# Patient Record
Sex: Female | Born: 1955 | Race: White | Hispanic: No | Smoking: Current every day smoker
Health system: Southern US, Community
[De-identification: ages and names within clinical notes are randomized; demographics above are authoritative.]

## PROBLEM LIST (undated history)

## (undated) DIAGNOSIS — M549 Dorsalgia, unspecified: Secondary | ICD-10-CM

## (undated) DIAGNOSIS — E041 Nontoxic single thyroid nodule: Secondary | ICD-10-CM

## (undated) DIAGNOSIS — Z972 Presence of dental prosthetic device (complete) (partial): Secondary | ICD-10-CM

## (undated) DIAGNOSIS — G8929 Other chronic pain: Secondary | ICD-10-CM

## (undated) DIAGNOSIS — I10 Essential (primary) hypertension: Secondary | ICD-10-CM

## (undated) DIAGNOSIS — T8859XA Other complications of anesthesia, initial encounter: Secondary | ICD-10-CM

## (undated) DIAGNOSIS — K635 Polyp of colon: Secondary | ICD-10-CM

## (undated) DIAGNOSIS — T4145XA Adverse effect of unspecified anesthetic, initial encounter: Secondary | ICD-10-CM

## (undated) DIAGNOSIS — K219 Gastro-esophageal reflux disease without esophagitis: Secondary | ICD-10-CM

## (undated) DIAGNOSIS — M5030 Other cervical disc degeneration, unspecified cervical region: Secondary | ICD-10-CM

## (undated) HISTORY — PX: ROTATOR CUFF REPAIR: SHX139

## (undated) HISTORY — PX: TONSILLECTOMY: SUR1361

## (undated) HISTORY — PX: PARTIAL HYSTERECTOMY: SHX80

---

## 2014-08-06 ENCOUNTER — Emergency Department: Payer: Medicare HMO

## 2014-08-06 ENCOUNTER — Encounter: Payer: Self-pay | Admitting: Emergency Medicine

## 2014-08-06 ENCOUNTER — Emergency Department
Admission: EM | Admit: 2014-08-06 | Discharge: 2014-08-06 | Disposition: A | Discharge status: Home / Self Care | Payer: Medicare HMO | Attending: Emergency Medicine | Admitting: Emergency Medicine

## 2014-08-06 DIAGNOSIS — Z79899 Other long term (current) drug therapy: Secondary | ICD-10-CM | POA: Insufficient documentation

## 2014-08-06 DIAGNOSIS — Y9389 Activity, other specified: Secondary | ICD-10-CM | POA: Insufficient documentation

## 2014-08-06 DIAGNOSIS — Z720 Tobacco use: Secondary | ICD-10-CM | POA: Insufficient documentation

## 2014-08-06 DIAGNOSIS — Y92009 Unspecified place in unspecified non-institutional (private) residence as the place of occurrence of the external cause: Secondary | ICD-10-CM | POA: Diagnosis not present

## 2014-08-06 DIAGNOSIS — S3992XA Unspecified injury of lower back, initial encounter: Secondary | ICD-10-CM | POA: Diagnosis not present

## 2014-08-06 DIAGNOSIS — S299XXA Unspecified injury of thorax, initial encounter: Secondary | ICD-10-CM | POA: Insufficient documentation

## 2014-08-06 DIAGNOSIS — M545 Low back pain, unspecified: Secondary | ICD-10-CM

## 2014-08-06 DIAGNOSIS — I10 Essential (primary) hypertension: Secondary | ICD-10-CM | POA: Diagnosis not present

## 2014-08-06 DIAGNOSIS — G8929 Other chronic pain: Secondary | ICD-10-CM | POA: Diagnosis not present

## 2014-08-06 DIAGNOSIS — Y998 Other external cause status: Secondary | ICD-10-CM | POA: Insufficient documentation

## 2014-08-06 DIAGNOSIS — R0781 Pleurodynia: Secondary | ICD-10-CM

## 2014-08-06 DIAGNOSIS — M546 Pain in thoracic spine: Secondary | ICD-10-CM

## 2014-08-06 DIAGNOSIS — W06XXXA Fall from bed, initial encounter: Secondary | ICD-10-CM | POA: Diagnosis not present

## 2014-08-06 HISTORY — DX: Essential (primary) hypertension: I10

## 2014-08-06 HISTORY — DX: Other cervical disc degeneration, unspecified cervical region: M50.30

## 2014-08-06 HISTORY — DX: Dorsalgia, unspecified: M54.9

## 2014-08-06 HISTORY — DX: Other chronic pain: G89.29

## 2014-08-06 LAB — URINALYSIS COMPLETE WITH MICROSCOPIC (ARMC ONLY)
Bacteria, UA: NONE SEEN
Bilirubin Urine: NEGATIVE
Glucose, UA: NEGATIVE mg/dL
Hgb urine dipstick: NEGATIVE
Ketones, ur: NEGATIVE mg/dL
NITRITE: NEGATIVE
PH: 5 (ref 5.0–8.0)
PROTEIN: NEGATIVE mg/dL
SPECIFIC GRAVITY, URINE: 1.023 (ref 1.005–1.030)

## 2014-08-06 MED ORDER — PIPERACILLIN-TAZOBACTAM 3.375 G IVPB
3.3750 g | Freq: Once | INTRAVENOUS | Status: DC
  Filled 2014-08-06: qty 50

## 2014-08-06 MED ORDER — OXYCODONE-ACETAMINOPHEN 5-325 MG PO TABS
2.0000 | ORAL_TABLET | Freq: Once | ORAL | Status: AC
  Administered 2014-08-06: 2 via ORAL
  Filled 2014-08-06: qty 2

## 2014-08-06 NOTE — ED Notes (Signed)
Patient transported to X-ray 

## 2014-08-06 NOTE — ED Provider Notes (Signed)
[facility] Emergency Department Provider Note  ____________________________________________  Time seen: Approximately 8:03 AM  I have reviewed the triage vital signs and the nursing notes.   HISTORY  Chief Complaint Back Pain and Fall    HPI Monica Mcclure is a 59 y.o. female who rolled out of bed this morning. The patient fell directly on her back and reports that she hit her head. The patient is having pain in her entire back worse in the lower back and the mid back. She denies LOC with the injury. The patient is also reporting some right sided rib pain as well. The patient reports that it felt as though the wind was knocked out of her. She is rating her pain a 10/10. She did not take anything for pain. The patient has a history of chronic back pain and has an appointment with orthopedics today. She also has been seen at a chronic pain physician.   Past Medical History  Diagnosis Date  . Hypertension   . Degenerative cervical disc   . Chronic back pain     There are no active problems to display for this patient.   Past Surgical History  Procedure Laterality Date  . Partial hysterectomy      Current Outpatient Rx  Name  Route  Sig  Dispense  Refill  . clonazePAM (KLONOPIN) 0.5 MG tablet   Oral   Take 0.5 mg by mouth 2 (two) times daily.         Marland Kitchen gabapentin (NEURONTIN) 600 MG tablet   Oral   Take 600 mg by mouth 4 (four) times daily.         Marland Kitchen HYDROmorphone (DILAUDID) 4 MG tablet   Oral   Take by mouth 4 (four) times daily.         Marland Kitchen lisinopril-hydrochlorothiazide (PRINZIDE,ZESTORETIC) 10-12.5 MG per tablet   Oral   Take 1 tablet by mouth 2 (two) times daily.         Marland Kitchen oxymorphone (OPANA ER) 40 MG 12 hr tablet   Oral   Take 40 mg by mouth every 12 (twelve) hours.           Allergies Tramadol  No family history on file.  Social History History  Substance Use Topics  . Smoking status: Current Every Day Smoker --  1.00 packs/day    Types: Cigarettes  . Smokeless tobacco: Not on file  . Alcohol Use: No    Review of Systems Constitutional: No fever/chills Eyes: No visual changes. ENT: No sore throat. Cardiovascular: Denies chest pain. Respiratory: Denies shortness of breath. Gastrointestinal: No abdominal pain.  No nausea, no vomiting.  No diarrhea.  No constipation. Genitourinary: Negative for dysuria. Musculoskeletal: Negative for back pain. Skin: Negative for rash. Neurological: Negative for headaches, focal weakness or numbness.  10-point ROS otherwise negative.  ____________________________________________   PHYSICAL EXAM:  VITAL SIGNS: ED Triage Vitals  Enc Vitals Group     BP 08/06/14 0558 156/90 mmHg     Pulse Rate 08/06/14 0558 86     Resp 08/06/14 0558 18     Temp 08/06/14 0558 98.6 F (37 C)     Temp Source 08/06/14 0558 Oral     SpO2 08/06/14 0556 98 %     Weight 08/06/14 0558 200 lb (90.719 kg)     Height 08/06/14 0558 5\' 6"  (1.676 m)     Head Cir --      Peak Flow --  Pain Score 08/06/14 0601 10     Pain Loc --      Pain Edu? --      Excl. in [place]? --     Constitutional: Alert and oriented. Well appearing and in moderate distress. Eyes: Conjunctivae are normal. PERRL. EOMI. Head: Atraumatic. Nose: No congestion/rhinnorhea. Mouth/Throat: Mucous membranes are moist.  Oropharynx non-erythematous. Neck: Mild cervical spine tenderness to palpation. Cardiovascular: Normal rate, regular rhythm. Grossly normal heart sounds.  Good peripheral circulation. Respiratory: Normal respiratory effort.  No retractions. Lungs CTAB. Gastrointestinal: Soft and nontender. No distention. Positive bowel sounds Genitourinary: Deferred Musculoskeletal: Mid back tenderness to palpation right sided tenderness to palpation upper lumbar spine tenderness to palpation lower back pain to palpation. Neurologic:  Normal speech and language.  Skin:  Skin is warm, dry and intact. No rash  noted. Psychiatric: Mood and affect are normal.  ____________________________________________   LABS (all labs ordered are listed, but only abnormal results are displayed)  Labs Reviewed - No data to display ____________________________________________  EKG  none ____________________________________________  RADIOLOGY  CT head and cervical spine: Negative CT of the head, no evidence for acute trauma, degenerative changes in the cervical spine without acute fracture or traumatic subluxation, incidental nodule in the right lower thyroid Thoracic spine x-ray: No acute fracture of the thoracic spine, question 12th rib fractures ____________________________________________   PROCEDURES  Procedure(s) performed: None  Critical Care performed: No  ____________________________________________   INITIAL IMPRESSION / ASSESSMENT AND PLAN / ED COURSE  Pertinent labs & imaging results that were available during my care of the patient were reviewed by me and considered in my medical decision making (see chart for details).  The patient received a dose of Percocet for her pain which she reports helped him very little bit. The patient may have some Tylenol for fractures but reports she has had rib fractures in the past. Given the new back pain and will do a lumbar spine x-ray as well to evaluate for acute fracture. The patient is still following with her orthopedic surgeon today.  The patient's care will be signed out to Dr Cinda Quest who will follow up the results of the xray. ____________________________________________   FINAL CLINICAL IMPRESSION(S) / ED DIAGNOSES  Final diagnoses:  Bilateral low back pain without sciatica  Rib pain  Midline thoracic back pain      Loney Hering, MD 08/06/14 [phone]

## 2014-08-06 NOTE — ED Notes (Addendum)
Patient present to ED via [facility] from home c/o of lower back pain after rolling out of bed this morning. Per EMS patient has history of chronic back pain but patient reports pain to a new spot on her back. Patient states "I rolled out of bed onto the concrete floor.Marland KitchenMarland KitchenI was having a dream and rolled out of bed; I hit my head and my back on the floor." Per EMS, fire department reports "after patient rolled out of bed, patient pulled her alarm, made her bed, and grabbed a bottle of water prior to their (fire department) arrival." Patient states she has a doctors appointment with [place] at 10 am this morning. No obvious swelling noted to the back of head. Patient denies LOC, chest pain, blurry vision , or shortness of breath. Patient alert and oriented x 4, respirations even and unlabored, call bell within reach. Dr. Dahlia Client at bedside.

## 2014-08-06 NOTE — ED Provider Notes (Signed)
T-spine x-ray thought they saw bilateral T12 rib fractures then a chest x-ray thought there was a ninth rib fracture or rib films think it may be a seventh rib fracture patient really doesn't have any particularly focal pain and none of the rib fractures are the same I will discharge her she has plenty of pain medicines at home she says that she will follow with orthopedic doctor  Nena Polio, MD 08/06/14 1205

## 2014-08-06 NOTE — ED Notes (Signed)
Lab notified to cancel CK order per Dr. Cinda Quest.

## 2014-08-15 ENCOUNTER — Other Ambulatory Visit: Payer: Self-pay | Admitting: Family Medicine

## 2014-08-15 DIAGNOSIS — Z1231 Encounter for screening mammogram for malignant neoplasm of breast: Secondary | ICD-10-CM

## 2014-08-15 DIAGNOSIS — E041 Nontoxic single thyroid nodule: Secondary | ICD-10-CM

## 2014-08-22 ENCOUNTER — Ambulatory Visit
Admission: RE | Admit: 2014-08-22 | Discharge: 2014-08-22 | Disposition: A | Discharge status: Home / Self Care | Payer: Medicare HMO | Source: Ambulatory Visit | Attending: Family Medicine | Admitting: Family Medicine

## 2014-08-22 DIAGNOSIS — E041 Nontoxic single thyroid nodule: Secondary | ICD-10-CM | POA: Diagnosis present

## 2014-08-22 DIAGNOSIS — E042 Nontoxic multinodular goiter: Secondary | ICD-10-CM | POA: Diagnosis not present

## 2014-10-01 ENCOUNTER — Other Ambulatory Visit: Payer: Self-pay | Admitting: Otolaryngology

## 2014-10-01 DIAGNOSIS — E041 Nontoxic single thyroid nodule: Secondary | ICD-10-CM

## 2014-10-03 ENCOUNTER — Other Ambulatory Visit: Payer: Self-pay | Admitting: Radiology

## 2014-10-04 ENCOUNTER — Ambulatory Visit
Admission: RE | Admit: 2014-10-04 | Discharge: 2014-10-04 | Disposition: A | Discharge status: Home / Self Care | Payer: Medicare HMO | Source: Ambulatory Visit | Attending: Otolaryngology | Admitting: Otolaryngology

## 2014-10-04 DIAGNOSIS — E041 Nontoxic single thyroid nodule: Secondary | ICD-10-CM | POA: Insufficient documentation

## 2014-10-04 HISTORY — DX: Adverse effect of unspecified anesthetic, initial encounter: T41.45XA

## 2014-10-04 HISTORY — DX: Nontoxic single thyroid nodule: E04.1

## 2014-10-04 HISTORY — DX: Other complications of anesthesia, initial encounter: T88.59XA

## 2014-10-04 NOTE — Procedures (Signed)
Procedure and risks were discussed with patient and informed consent was obtained. US-guided thyroid FNA will be performed.

## 2014-10-04 NOTE — Procedures (Signed)
Under US guidance, FNA of right thyroid nodule was performed.

## 2014-10-07 LAB — CYTOLOGY - NON PAP

## 2014-10-15 ENCOUNTER — Other Ambulatory Visit: Payer: Self-pay | Admitting: Otolaryngology

## 2014-10-15 DIAGNOSIS — E041 Nontoxic single thyroid nodule: Secondary | ICD-10-CM

## 2014-10-16 ENCOUNTER — Encounter: Payer: Self-pay | Admitting: *Deleted

## 2014-10-17 ENCOUNTER — Encounter
Admission: RE | Disposition: A | Discharge status: Home / Self Care | Payer: Self-pay | Source: Ambulatory Visit | Attending: Gastroenterology

## 2014-10-17 ENCOUNTER — Encounter: Payer: Self-pay | Admitting: Anesthesiology

## 2014-10-17 ENCOUNTER — Ambulatory Visit
Admission: RE | Admit: 2014-10-17 | Discharge: 2014-10-17 | Disposition: A | Discharge status: Home / Self Care | Payer: Medicare HMO | Source: Ambulatory Visit | Attending: Gastroenterology | Admitting: Gastroenterology

## 2014-10-17 ENCOUNTER — Ambulatory Visit: Payer: Medicare HMO | Admitting: Anesthesiology

## 2014-10-17 DIAGNOSIS — Z888 Allergy status to other drugs, medicaments and biological substances status: Secondary | ICD-10-CM | POA: Diagnosis not present

## 2014-10-17 DIAGNOSIS — D128 Benign neoplasm of rectum: Secondary | ICD-10-CM | POA: Insufficient documentation

## 2014-10-17 DIAGNOSIS — Z1211 Encounter for screening for malignant neoplasm of colon: Secondary | ICD-10-CM | POA: Diagnosis present

## 2014-10-17 DIAGNOSIS — M1990 Unspecified osteoarthritis, unspecified site: Secondary | ICD-10-CM | POA: Diagnosis not present

## 2014-10-17 DIAGNOSIS — M5030 Other cervical disc degeneration, unspecified cervical region: Secondary | ICD-10-CM | POA: Insufficient documentation

## 2014-10-17 DIAGNOSIS — Z91013 Allergy to seafood: Secondary | ICD-10-CM | POA: Insufficient documentation

## 2014-10-17 DIAGNOSIS — F17210 Nicotine dependence, cigarettes, uncomplicated: Secondary | ICD-10-CM | POA: Insufficient documentation

## 2014-10-17 DIAGNOSIS — K644 Residual hemorrhoidal skin tags: Secondary | ICD-10-CM | POA: Diagnosis not present

## 2014-10-17 DIAGNOSIS — I10 Essential (primary) hypertension: Secondary | ICD-10-CM | POA: Insufficient documentation

## 2014-10-17 DIAGNOSIS — K5730 Diverticulosis of large intestine without perforation or abscess without bleeding: Secondary | ICD-10-CM | POA: Diagnosis not present

## 2014-10-17 DIAGNOSIS — Z885 Allergy status to narcotic agent status: Secondary | ICD-10-CM | POA: Insufficient documentation

## 2014-10-17 DIAGNOSIS — G8929 Other chronic pain: Secondary | ICD-10-CM | POA: Insufficient documentation

## 2014-10-17 DIAGNOSIS — M549 Dorsalgia, unspecified: Secondary | ICD-10-CM | POA: Insufficient documentation

## 2014-10-17 DIAGNOSIS — K635 Polyp of colon: Secondary | ICD-10-CM | POA: Insufficient documentation

## 2014-10-17 HISTORY — PX: COLONOSCOPY WITH PROPOFOL: SHX5780

## 2014-10-17 SURGERY — COLONOSCOPY WITH PROPOFOL
Anesthesia: General

## 2014-10-17 MED ORDER — FENTANYL CITRATE (PF) 100 MCG/2ML IJ SOLN
INTRAMUSCULAR | Status: DC | PRN
  Administered 2014-10-17: 50 ug via INTRAVENOUS

## 2014-10-17 MED ORDER — EPHEDRINE SULFATE 50 MG/ML IJ SOLN
INTRAMUSCULAR | Status: DC | PRN
  Administered 2014-10-17: 5 mg via INTRAVENOUS

## 2014-10-17 MED ORDER — SODIUM CHLORIDE 0.9 % IV SOLN
INTRAVENOUS | Status: DC
Start: 2014-10-17 — End: 2014-10-17
  Administered 2014-10-17: 09:00:00 via INTRAVENOUS

## 2014-10-17 MED ORDER — PROPOFOL 500 MG/50ML IV EMUL
INTRAVENOUS | Status: DC | PRN
  Administered 2014-10-17: 100 ug/kg/min via INTRAVENOUS

## 2014-10-17 MED ORDER — MIDAZOLAM HCL 2 MG/2ML IJ SOLN
INTRAMUSCULAR | Status: DC | PRN
  Administered 2014-10-17: 1 mg via INTRAVENOUS

## 2014-10-17 NOTE — Anesthesia Preprocedure Evaluation (Signed)
Anesthesia Evaluation  Patient identified by MRN, date of birth, ID band Patient awake    Reviewed: Allergy & Precautions, H&P , NPO status , Patient's Chart, lab work & pertinent test results  History of Anesthesia Complications (+) PROLONGED EMERGENCE and history of anesthetic complications  Airway Mallampati: III  TM Distance: >3 FB Neck ROM: limited    Dental  (+) Poor Dentition, Missing, Upper Dentures, Lower Dentures   Pulmonary neg shortness of breath, Current Smoker,    Pulmonary exam normal breath sounds clear to auscultation       Cardiovascular Exercise Tolerance: Good hypertension, (-) Past MI Normal cardiovascular exam Rhythm:regular Rate:Normal     Neuro/Psych negative neurological ROS  negative psych ROS   GI/Hepatic negative GI ROS, Neg liver ROS,   Endo/Other  negative endocrine ROS  Renal/GU negative Renal ROS  negative genitourinary   Musculoskeletal  (+) Arthritis ,   Abdominal   Peds  Hematology negative hematology ROS (+)   Anesthesia Other Findings Past Medical History:   Hypertension                                                 Degenerative cervical disc                                   Chronic back pain                                            Complication of anesthesia                                     Comment:"hard time waking me up"   Thyroid nodule                                              Past Surgical History:   PARTIAL HYSTERECTOMY                                          ROTATOR CUFF REPAIR                             Right               Reproductive/Obstetrics negative OB ROS                             Anesthesia Physical Anesthesia Plan  ASA: III  Anesthesia Plan: General   Post-op Pain Management:    Induction:   Airway Management Planned:   Additional Equipment:   Intra-op Plan:   Post-operative Plan:   Informed  Consent: I have reviewed the patients History and Physical, chart, labs and discussed the procedure including the risks, benefits and alternatives for the proposed anesthesia with the patient or authorized representative who  has indicated his/her understanding and acceptance.   Dental Advisory Given  Plan Discussed with: Anesthesiologist, CRNA and Surgeon  Anesthesia Plan Comments:         Anesthesia Quick Evaluation

## 2014-10-17 NOTE — Discharge Instructions (Signed)

## 2014-10-17 NOTE — H&P (Signed)
  Primary Care Physician:  Lynnell Jude, MD  Pre-Procedure History & Physical: HPI:  Monica Mcclure is a 59 y.o. female is here for an colonoscopy.   Past Medical History  Diagnosis Date  . Hypertension   . Degenerative cervical disc   . Chronic back pain   . Complication of anesthesia     "hard time waking me up"  . Thyroid nodule     Past Surgical History  Procedure Laterality Date  . Partial hysterectomy    . Rotator cuff repair Right     Prior to Admission medications   Medication Sig Start Date End Date Taking? Authorizing Provider  clonazePAM (KLONOPIN) 0.5 MG tablet Take 0.5 mg by mouth 2 (two) times daily.   Yes Historical Provider, MD  escitalopram (LEXAPRO) 20 MG tablet Take 20 mg by mouth daily.   Yes Historical Provider, MD  gabapentin (NEURONTIN) 600 MG tablet Take 600 mg by mouth 4 (four) times daily.   Yes Historical Provider, MD  HYDROmorphone (DILAUDID) 4 MG tablet Take by mouth 4 (four) times daily.   Yes Historical Provider, MD  lisinopril-hydrochlorothiazide (PRINZIDE,ZESTORETIC) 10-12.5 MG per tablet Take 1 tablet by mouth 2 (two) times daily.   Yes Historical Provider, MD  oxymorphone (OPANA ER) 40 MG 12 hr tablet Take 40 mg by mouth every 12 (twelve) hours.   Yes Historical Provider, MD    Allergies as of 09/25/2014 - Review Complete 08/06/2014  Allergen Reaction Noted  . Tramadol  08/06/2014    History reviewed. No pertinent family history.  Social History   Social History  . Marital Status: Widowed    Spouse Name: N/A  . Number of Children: N/A  . Years of Education: N/A   Occupational History  . Not on file.   Social History Main Topics  . Smoking status: Current Every Day Smoker -- 1.00 packs/day    Types: Cigarettes  . Smokeless tobacco: Not on file  . Alcohol Use: No  . Drug Use: Not on file  . Sexual Activity: Not on file   Other Topics Concern  . Not on file   Social History Narrative     Physical Exam: BP 151/88  mmHg  Pulse 84  Temp(Src) 98.1 F (36.7 C) (Tympanic)  Resp 20  SpO2 97% General:   Alert,  pleasant and cooperative in NAD Head:  Normocephalic and atraumatic. Neck:  Supple; no masses or thyromegaly. Lungs:  Clear throughout to auscultation.    Heart:  Regular rate and rhythm. Abdomen:  Soft, nontender and nondistended. Normal bowel sounds, without guarding, and without rebound.   Neurologic:  Alert and  oriented x4;  grossly normal neurologically.  Impression/Plan: [name] Overall is here for an colonoscopy to be performed for screening  Risks, benefits, limitations, and alternatives regarding  colonoscopy have been reviewed with the patient.  Questions have been answered.  All parties agreeable.   [name], MD  10/17/2014, 9:50 AM

## 2014-10-17 NOTE — Transfer of Care (Signed)
Immediate Anesthesia Transfer of Care Note  Patient: [name] [facility]  Procedure(s) Performed: Procedure(s): COLONOSCOPY WITH PROPOFOL (N/A)  Patient Location: PACU  Anesthesia Type:General  Level of Consciousness: awake, alert , oriented and sedated  Airway & Oxygen Therapy: Patient Spontanous Breathing and Patient connected to nasal cannula oxygen  Post-op Assessment: Report given to RN and Post -op Vital signs reviewed and stable  Post vital signs: Reviewed and stable  Last Vitals:  Filed Vitals:   10/17/14 0859  BP: 151/88  Pulse: 84  Temp: 36.7 C  Resp: 20    Complications: No apparent anesthesia complications

## 2014-10-17 NOTE — Op Note (Signed)
[facility] Gastroenterology Patient Name: Monica Mcclure Procedure Date: 10/17/2014 10:04 AM MRN: [id number] Account #: [id number] Date of Birth: 17-Aug-1955 Admit Type: Outpatient Age: 59 [facility]OM 3 Gender: Female Note Status: Finalized Procedure:         Colonoscopy Indications:       Screening for colorectal malignant neoplasm, This is the                     patient's first colonoscopy Patient Profile:   This is a 59 year old female. Providers:         Gerrit Heck. Rayann Heman, MD Referring MD:      Reyes Ivan, MD (Referring MD) Medicines:         Propofol per Anesthesia Complications:     No immediate complications. Procedure:         Pre-Anesthesia Assessment:                    - Prior to the procedure, a History and Physical was                     performed, and patient medications, allergies and                     sensitivities were reviewed. The patient's tolerance of                     previous anesthesia was reviewed.                    After obtaining informed consent, the colonoscope was                     passed under direct vision. Throughout the procedure, the                     patient's blood pressure, pulse, and oxygen saturations                     were monitored continuously. The Colonoscope was                     introduced through the anus and advanced to the the cecum,                     identified by appendiceal orifice and ileocecal valve. The                     colonoscopy was performed without difficulty. The patient                     tolerated the procedure well. The quality of the bowel                     preparation was excellent. Findings:      The perianal exam findings include non-thrombosed external hemorrhoids.      Many small and large-mouthed diverticula were found in the sigmoid colon.      Two sessile polyps were found in the transverse colon. The polyps were 3       to 4 mm in size. These polyps were  removed with a cold snare. Resection       and retrieval were complete.      A 7 mm polyp was found in the rectum. The polyp was  semi-pedunculated.       The polyp was removed with a hot snare. Resection and retrieval were       complete.      The exam was otherwise without abnormality on direct and retroflexion       views. Impression:        - Non-thrombosed external hemorrhoids found on perianal                     exam.                    - Diverticulosis in the sigmoid colon.                    - Two 3 to 4 mm polyps in the transverse colon. Resected                     and retrieved.                    - One 7 mm polyp in the rectum. Resected and retrieved.                    - The examination was otherwise normal on direct and                     retroflexion views. Recommendation:    - Observe patient in GI recovery unit.                    - High fiber diet.                    - Continue present medications.                    - Await pathology results.                    - Repeat colonoscopy for surveillance based on pathology                     results.                    - Return to referring physician.                    - The findings and recommendations were discussed with the                     patient.                    - The findings and recommendations were discussed with the                     patient's family. Procedure Code(s): --- Professional ---                    [phone], Colonoscopy, flexible; with removal of tumor(s),                     polyp(s), or other lesion(s) by snare technique CPT copyright 2014 American Medical Association. All rights reserved. The codes documented in this report are preliminary and upon coder review may  be revised to meet current compliance requirements. Mellody Life, MD 10/17/2014 10:34:46 AM This report has been signed electronically. Number of Addenda: 0 Note Initiated  On: 10/17/2014 10:04 AM Scope Withdrawal Time: 0  hours 18 minutes 59 seconds  Total Procedure Duration: 0 hours 21 minutes 33 seconds       [facility]

## 2014-10-17 NOTE — Anesthesia Procedure Notes (Signed)
Performed by: COOK-MARTIN, Lamija Besse Pre-anesthesia Checklist: Patient identified, Emergency Drugs available, Suction available, Patient being monitored and Timeout performed Patient Re-evaluated:Patient Re-evaluated prior to inductionOxygen Delivery Method: Nasal cannula Preoxygenation: Pre-oxygenation with 100% oxygen Intubation Type: IV induction Placement Confirmation: positive ETCO2 and CO2 detector       

## 2014-10-17 NOTE — Anesthesia Postprocedure Evaluation (Signed)
  Anesthesia Post-op Note  Patient: Monica Mcclure  Procedure(s) Performed: Procedure(s): COLONOSCOPY WITH PROPOFOL (N/A)  Anesthesia type:General  Patient location: PACU  Post pain: Pain level controlled  Post assessment: Post-op Vital signs reviewed, Patient's Cardiovascular Status Stable, Respiratory Function Stable, Patent Airway and No signs of Nausea or vomiting  Post vital signs: Reviewed and stable  Last Vitals:  Filed Vitals:   10/17/14 1110  BP: 111/67  Pulse: 62  Temp:   Resp: 14    Level of consciousness: awake, alert  and patient cooperative  Complications: No apparent anesthesia complications

## 2014-10-18 LAB — SURGICAL PATHOLOGY

## 2014-10-21 ENCOUNTER — Other Ambulatory Visit: Payer: Self-pay | Admitting: Physician Assistant

## 2014-10-22 ENCOUNTER — Ambulatory Visit
Admission: RE | Admit: 2014-10-22 | Discharge: 2014-10-22 | Disposition: A | Discharge status: Home / Self Care | Payer: Medicare HMO | Source: Ambulatory Visit | Attending: Otolaryngology | Admitting: Otolaryngology

## 2014-10-22 DIAGNOSIS — E041 Nontoxic single thyroid nodule: Secondary | ICD-10-CM | POA: Insufficient documentation

## 2014-10-22 HISTORY — DX: Polyp of colon: K63.5

## 2014-10-22 NOTE — Discharge Instructions (Signed)

## 2014-10-22 NOTE — Procedures (Signed)
R thyroid FNA No comp/EBL

## 2014-10-23 ENCOUNTER — Encounter: Payer: Self-pay | Admitting: Gastroenterology

## 2014-10-23 LAB — CYTOLOGY - NON PAP

## 2014-10-30 ENCOUNTER — Other Ambulatory Visit: Payer: Self-pay | Admitting: Otolaryngology

## 2014-10-30 DIAGNOSIS — E041 Nontoxic single thyroid nodule: Secondary | ICD-10-CM

## 2015-01-21 ENCOUNTER — Ambulatory Visit
Admission: RE | Admit: 2015-01-21 | Discharge: 2015-01-21 | Disposition: A | Discharge status: Home / Self Care | Payer: Medicare HMO | Source: Ambulatory Visit | Attending: Otolaryngology | Admitting: Otolaryngology

## 2015-01-21 DIAGNOSIS — E041 Nontoxic single thyroid nodule: Secondary | ICD-10-CM | POA: Diagnosis present

## 2015-01-21 DIAGNOSIS — E042 Nontoxic multinodular goiter: Secondary | ICD-10-CM | POA: Diagnosis not present

## 2015-02-20 ENCOUNTER — Other Ambulatory Visit: Payer: Self-pay | Admitting: Otolaryngology

## 2015-02-20 DIAGNOSIS — R1310 Dysphagia, unspecified: Secondary | ICD-10-CM

## 2015-02-27 ENCOUNTER — Ambulatory Visit
Admission: RE | Admit: 2015-02-27 | Discharge: 2015-02-27 | Disposition: A | Discharge status: Home / Self Care | Payer: Medicare HMO | Source: Ambulatory Visit | Attending: Otolaryngology | Admitting: Otolaryngology

## 2015-02-27 DIAGNOSIS — K449 Diaphragmatic hernia without obstruction or gangrene: Secondary | ICD-10-CM | POA: Diagnosis not present

## 2015-02-27 DIAGNOSIS — R1310 Dysphagia, unspecified: Secondary | ICD-10-CM | POA: Diagnosis not present

## 2015-06-13 ENCOUNTER — Ambulatory Visit (INDEPENDENT_AMBULATORY_CARE_PROVIDER_SITE_OTHER): Payer: Medicare HMO | Admitting: Sports Medicine

## 2015-06-13 ENCOUNTER — Encounter: Payer: Self-pay | Admitting: Sports Medicine

## 2015-06-13 DIAGNOSIS — B351 Tinea unguium: Secondary | ICD-10-CM | POA: Diagnosis not present

## 2015-06-13 DIAGNOSIS — M79674 Pain in right toe(s): Secondary | ICD-10-CM | POA: Diagnosis not present

## 2015-06-13 DIAGNOSIS — L600 Ingrowing nail: Secondary | ICD-10-CM | POA: Diagnosis not present

## 2015-06-13 DIAGNOSIS — M79675 Pain in left toe(s): Secondary | ICD-10-CM | POA: Diagnosis not present

## 2015-06-13 NOTE — Progress Notes (Signed)
Patient ID: Monica Mcclure, female   DOB: [birth year], 60 y.o.   MRN: [id number] Subjective: Monica Mcclure is a 60 y.o. female patient seen today in office with complaint of painful thickened and elongated toenails; unable to trim. States that sometimes her nails grow down in the skin. Patient denies history of Diabetes, Neuropathy, or Vascular disease. Admits to chronic back pain on Neurontin. Patient has no other pedal complaints at this time.   There are no active problems to display for this patient.   Current Outpatient Prescriptions on File Prior to Visit  Medication Sig Dispense Refill  . clonazePAM (KLONOPIN) 0.5 MG tablet Take 0.5 mg by mouth 2 (two) times daily.    Marland Kitchen escitalopram (LEXAPRO) 20 MG tablet Take 20 mg by mouth daily.    Marland Kitchen gabapentin (NEURONTIN) 600 MG tablet Take 600 mg by mouth 4 (four) times daily.    Marland Kitchen gabapentin (NEURONTIN) 800 MG tablet Take 800 mg by mouth 3 (three) times daily.    Marland Kitchen HYDROmorphone (DILAUDID) 4 MG tablet Take by mouth 4 (four) times daily.    Marland Kitchen lisinopril-hydrochlorothiazide (PRINZIDE,ZESTORETIC) 10-12.5 MG per tablet Take 1 tablet by mouth 2 (two) times daily.    Marland Kitchen oxymorphone (OPANA ER) 40 MG 12 hr tablet Take 40 mg by mouth every 12 (twelve) hours.     No current facility-administered medications on file prior to visit.    Allergies  Allergen Reactions  . Shellfish Allergy Other (See Comments) and Anaphylaxis    "makes my throat close"  . Iodinated Diagnostic Agents Hives    "Some food dyes"  . Other Hives    SSRI's  . Tramadol Swelling  . Serzone [Nefazodone] Rash    Objective: Physical Exam  General: Well developed, nourished, no acute distress, awake, alert and oriented x 3  Vascular: Dorsalis pedis artery 2/4 bilateral, Posterior tibial artery 2/4 bilateral, skin temperature warm to warm proximal to distal bilateral lower extremities, no varicosities, pedal hair present bilateral.  Neurological: Gross sensation present via  light touch bilateral.   Dermatological: Skin is warm, dry, and supple bilateral, Nails 1-10 are tender, long, thick, and discolored with mild subungal debris and mild incurvation of medial and lateral nail margins of hallux bilateral with no acute infection, no webspace macerations present bilateral, no open lesions present bilateral, no callus/corns/hyperkeratotic tissue present bilateral. No signs of infection bilateral.  Musculoskeletal: No symptomatic boney deformities noted bilateral. Muscular strength within normal limits without painon range of motion. No pain with calf compression bilateral.  Assessment and Plan:  Problem List Items Addressed This Visit    None    Visit Diagnoses    Dermatophytosis of nail    -  Primary    Onychocryptosis        Toe pain, bilateral          -Examined patient.  -Discussed treatment options for painful mycotic nails. -Mechanically debrided and reduced mycotic nails with sterile nail nipper and dremel nail file without incident. -Patient to return in 3 months for follow up evaluation or sooner if symptoms worsen.  Landis Martins, DPM

## 2015-06-24 ENCOUNTER — Other Ambulatory Visit: Payer: Self-pay | Admitting: Family Medicine

## 2015-06-24 ENCOUNTER — Ambulatory Visit
Admission: RE | Admit: 2015-06-24 | Discharge: 2015-06-24 | Disposition: A | Discharge status: Home / Self Care | Payer: Medicare HMO | Source: Ambulatory Visit | Attending: Family Medicine | Admitting: Family Medicine

## 2015-06-24 DIAGNOSIS — Z1231 Encounter for screening mammogram for malignant neoplasm of breast: Secondary | ICD-10-CM

## 2015-07-03 ENCOUNTER — Other Ambulatory Visit: Payer: Self-pay | Admitting: Family Medicine

## 2015-07-03 DIAGNOSIS — R928 Other abnormal and inconclusive findings on diagnostic imaging of breast: Secondary | ICD-10-CM

## 2015-07-22 ENCOUNTER — Ambulatory Visit
Admission: RE | Admit: 2015-07-22 | Discharge: 2015-07-22 | Disposition: A | Discharge status: Home / Self Care | Payer: Medicare HMO | Source: Ambulatory Visit | Attending: Family Medicine | Admitting: Family Medicine

## 2015-07-22 ENCOUNTER — Emergency Department
Admission: EM | Admit: 2015-07-22 | Discharge: 2015-07-22 | Disposition: A | Discharge status: Home / Self Care | Payer: Medicare HMO | Attending: Emergency Medicine | Admitting: Emergency Medicine

## 2015-07-22 ENCOUNTER — Encounter: Payer: Self-pay | Admitting: Emergency Medicine

## 2015-07-22 ENCOUNTER — Emergency Department: Payer: Medicare HMO

## 2015-07-22 DIAGNOSIS — S9031XA Contusion of right foot, initial encounter: Secondary | ICD-10-CM | POA: Insufficient documentation

## 2015-07-22 DIAGNOSIS — M79671 Pain in right foot: Secondary | ICD-10-CM | POA: Diagnosis present

## 2015-07-22 DIAGNOSIS — Y939 Activity, unspecified: Secondary | ICD-10-CM | POA: Diagnosis not present

## 2015-07-22 DIAGNOSIS — Z79891 Long term (current) use of opiate analgesic: Secondary | ICD-10-CM | POA: Insufficient documentation

## 2015-07-22 DIAGNOSIS — S92501A Displaced unspecified fracture of right lesser toe(s), initial encounter for closed fracture: Secondary | ICD-10-CM

## 2015-07-22 DIAGNOSIS — I10 Essential (primary) hypertension: Secondary | ICD-10-CM | POA: Insufficient documentation

## 2015-07-22 DIAGNOSIS — Z79899 Other long term (current) drug therapy: Secondary | ICD-10-CM | POA: Insufficient documentation

## 2015-07-22 DIAGNOSIS — R928 Other abnormal and inconclusive findings on diagnostic imaging of breast: Secondary | ICD-10-CM | POA: Insufficient documentation

## 2015-07-22 DIAGNOSIS — S92514A Nondisplaced fracture of proximal phalanx of right lesser toe(s), initial encounter for closed fracture: Secondary | ICD-10-CM | POA: Diagnosis not present

## 2015-07-22 DIAGNOSIS — Y999 Unspecified external cause status: Secondary | ICD-10-CM | POA: Diagnosis not present

## 2015-07-22 DIAGNOSIS — Y929 Unspecified place or not applicable: Secondary | ICD-10-CM | POA: Insufficient documentation

## 2015-07-22 DIAGNOSIS — F17210 Nicotine dependence, cigarettes, uncomplicated: Secondary | ICD-10-CM | POA: Insufficient documentation

## 2015-07-22 DIAGNOSIS — X58XXXA Exposure to other specified factors, initial encounter: Secondary | ICD-10-CM | POA: Insufficient documentation

## 2015-07-22 NOTE — ED Provider Notes (Signed)
[facility]  Emergency Department Provider Note __________________________  Time seen: Approximately 4:16 PM  I have reviewed the triage vital signs and the nursing notes.   HISTORY  Chief Complaint Foot Pain   HPI Monica Mcclure is a 60 y.o. female presents with complaint of right foot pain. Patient reports on 07/10/2015 she was visiting a friend in [place]. Patient reports when she was walking through the bedroom she stubbed her right fifth toe and foot on the end of the bed. Patient reports that she thought she broke her fifth toe but states she did not seek medical care as she thought there is do for a broken toe. Patient reports as the next few days past she began to develop bruising on the top inside of her foot as well as pain elsewhere in right foot. Patient presents for pain to right fourth and fifth toes as well as top of foot and right side of foot. Patient states that right foot pain is mostly with weightbearing and movement or direct palpation. States minimal pain at rest. States pain is mild to moderate.  Denies pain radiation. Denies numbness or tingling sensation. Denies fall to the ground. Denies head injury or loss of consciousness. Denies any other pain or injury. Denies other extremity pain or injury. Denies recent sickness. Denies extremity swelling, calf pain or recent immobilization.  Patient reports that she does have chronic low back pain which he follows with pain clinic. Patient reports she is currently taking Opana as well as hydromorphone, denies any change in her chronic low back pain.  Pain management: [place] [facility]  Past Medical History  Diagnosis Date  . Hypertension   . Degenerative cervical disc   . Chronic back pain   . Complication of anesthesia     "hard time waking me up"  . Thyroid nodule   . Colon polyps     There are no active problems to display for this patient.   Past Surgical History  Procedure  Laterality Date  . Partial hysterectomy    . Rotator cuff repair Right   . Colonoscopy with propofol N/A 10/17/2014    Procedure: COLONOSCOPY WITH PROPOFOL;  Surgeon: [name], MD;  Location: [facility] ENDOSCOPY;  Service: Endoscopy;  Laterality: N/A;    Current Outpatient Rx  Name  Route  Sig  Dispense  Refill  . clonazePAM (KLONOPIN) 0.5 MG tablet   Oral   Take 0.5 mg by mouth 2 (two) times daily.         . Diethylpropion HCl 25 MG TABS   Oral   Take 1 tablet by mouth 3 (three) times daily.      2   . escitalopram (LEXAPRO) 20 MG tablet   Oral   Take 20 mg by mouth daily.         . fentaNYL (DURAGESIC - DOSED MCG/HR) 75 MCG/HR      APPLY 1 PATCH(ES) EVERY 72 HOURS BY TRANSDERMAL ROUTE FOR 30 DAYS.      0   . gabapentin (NEURONTIN) 600 MG tablet   Oral   Take 600 mg by mouth 4 (four) times daily.         Marland Kitchen gabapentin (NEURONTIN) 800 MG tablet   Oral   Take 800 mg by mouth 3 (three) times daily.         Marland Kitchen HYDROmorphone (DILAUDID) 4 MG tablet   Oral   Take by mouth 4 (four) times daily.         Marland Kitchen  lisinopril-hydrochlorothiazide (PRINZIDE,ZESTORETIC) 10-12.5 MG per tablet   Oral   Take 1 tablet by mouth 2 (two) times daily.         Marland Kitchen oxymorphone (OPANA ER) 40 MG 12 hr tablet   Oral   Take 40 mg by mouth every 12 (twelve) hours.           Allergies Shellfish allergy; Iodinated diagnostic agents; Other; Tramadol; and Serzone  Family History  Problem Relation Age of Onset  . Breast cancer Neg Hx     Social History Social History  Substance Use Topics  . Smoking status: Current Every Day Smoker -- 1.00 packs/day    Types: Cigarettes  . Smokeless tobacco: None  . Alcohol Use: No    Review of Systems Constitutional: No fever/chills Eyes: No visual changes. ENT: No sore throat. Cardiovascular: Denies chest pain. Respiratory: Denies shortness of breath. Gastrointestinal: No abdominal pain.  No nausea, no vomiting.  No diarrhea.  No  constipation. Genitourinary: Negative for dysuria. Musculoskeletal: Positive chronic low back pain, denies changes in chronic low back pain..As above. Skin: Negative for rash. Neurological: Negative for headaches, focal weakness or numbness.  10-point ROS otherwise negative.  ____________________________________________   PHYSICAL EXAM:  VITAL SIGNS: ED Triage Vitals  Enc Vitals Group     BP 07/22/15 1542 136/79 mmHg     Pulse Rate 07/22/15 1542 77     Resp 07/22/15 1542 18     Temp 07/22/15 1542 98.4 F (36.9 C)     Temp Source 07/22/15 1542 Oral     SpO2 07/22/15 1542 96 %     Weight 07/22/15 1542 185 lb (83.915 kg)     Height 07/22/15 1542 5' 5.5" (1.664 m)     Head Cir --      Peak Flow --      Pain Score 07/22/15 1543 9     Pain Loc --      Pain Edu? --      Excl. in [place]? --     Constitutional: Alert and oriented. Well appearing and in no acute distress. Eyes: Conjunctivae are normal. PERRL. EOMI. Head: Atraumatic.  Ears: Normal external appearance bilaterally.  Nose: No congestion/rhinnorhea.  Mouth/Throat: Mucous membranes are moist.  Oropharynx non-erythematous. Neck: No stridor.  No cervical spine tenderness to palpation. Cardiovascular: Normal rate, regular rhythm. Grossly normal heart sounds.  Good peripheral circulation. Respiratory: Normal respiratory effort.  No retractions. Lungs CTAB. No wheezes, rales or rhonchi. Musculoskeletal: No lower or upper extremity tenderness nor edema. Bilateral pedal pulses equal and easily palpated.  Except: Right foot fourth and fifth toes mild to moderate tenderness to palpation with mild ecchymosis, mild tenderness to palpation dorsal right foot over mid first to second metatarsal tarsal, right lateral foot along the fifth metatarsal mild to moderate tenderness to palpation with mild ecchymosis and mild swelling. Right foot full range of motion present, normal capillary refill to right foot all toes, skin intact, sensation  intact to distal toes of right foot. No calf tenderness bilaterally.  Neurologic:  Normal speech and language. No gross focal neurologic deficits are appreciated. Skin:  Skin is warm, dry and intact. No rash noted. Psychiatric: Mood and affect are normal. Speech and behavior are normal.  ____________________________________________   LABS (all labs ordered are listed, but only abnormal results are displayed)  Labs Reviewed - No data to display    __RADIOLOGY EXAM: RIGHT FOOT COMPLETE - 3+ VIEW  COMPARISON: None.  FINDINGS: There is a nondisplaced intra-articular fracture  involving the proximal phalanx of the fifth toe. No other fractures are identified. A small calcaneal heel spur is noted.  IMPRESSION: Nondisplaced intra-articular fracture involving proximal phalanx of the fifth toe.   Electronically Signed By: Marijo Sanes M.D. On: 07/22/2015 16:30 ____________________________________________   PROCEDURES  Procedure(s) performed:   Right foot postoperative shoe applied and fourth and fifth toes buddy taped by RN. Neurovascular intact post application.  ____________________________________________   INITIAL IMPRESSION / ASSESSMENT AND PLAN / ED COURSE  Pertinent labs & imaging results that were available during my care of the patient were reviewed by me and considered in my medical decision making (see chart for details).  Well-appearing patient. No acute distress. Presents for the complaints of right foot pain. Reports on 07/10/2015 stab to right foot on in the bed. Patient reports she had immediate fourth and fifth toe pain that has had gradual onset of pain elsewhere and right foot with some swelling and bruising. Denies any other fall or trauma. Denies any numbness, loss of sensation or pain radiation. Denies calf tenderness. Will evaluate right foot x-ray.  Right foot x-ray reviewed. Per radiologist's nondisplaced intra-articular fracture involving  proximal phalanx of the fifth toe, and no other fractures are identified. Discussed and he felt treatment with patient. Fourth and fifth toes buddy taped and postoperative shoe applied. Encouraged ice, elevation and podiatry follow-up. Patient states that she does have a podiatrist that she follows with intermittently for ingrown toenails and states she'll follow-up with them but information for Dr. Vickki Muff also given. Continue home pain medication as directed.  Discussed follow up with Primary care physician this week. Discussed follow up and return parameters including no resolution or any worsening concerns. Patient verbalized understanding and agreed to plan.   ____________________________________________   FINAL CLINICAL IMPRESSION(S) / ED DIAGNOSES  Final diagnoses:  Fracture of fifth toe, right, closed, initial encounter  Foot contusion, right, initial encounter     New Prescriptions   No medications on file    Note: This dictation was prepared with Dragon dictation along with smaller phrase technology. Any transcriptional errors that result from this process are unintentional.       Marylene Land, NP 07/22/15 [place], MD 07/22/15 2011

## 2015-07-22 NOTE — ED Notes (Signed)
Patient to ER for c/o right foot pain. States on 07/10/15, broke 5th toe. Now having bruising and pain to sides of foot and top of foot.

## 2015-07-22 NOTE — Discharge Instructions (Signed)
Rest, apply ice and elevate. Buddy tape and wear post operative shoe.   Follow up with podiatry this week, see above to call to schedule.  Follow up with your primary care physician this week as needed. Return to Urgent care for new or worsening concerns.    Toe Fracture A toe fracture is a break in one of the toe bones (phalanges). CAUSES This condition may be caused by:  Dropping a heavy object on your toe.  Stubbing your toe.  Overusing your toe or doing repetitive exercise.  Twisting or stretching your toe out of place. RISK FACTORS This condition is more likely to develop in people who:  Play contact sports.  Have a bone disease.  Have a low calcium level. SYMPTOMS The main symptoms of this condition are swelling and pain in the toe. The pain may get worse with standing or walking. Other symptoms include:  Bruising.  Stiffness.  Numbness.  A change in the way the toe looks.  Broken bones that poke through the skin.  Blood beneath the toenail. DIAGNOSIS This condition is diagnosed with a physical exam. You may also have X-rays. TREATMENT  Treatment for this condition depends on the type of fracture and its severity. Treatment may involve:  Taping the broken toe to a toe that is next to it (buddy taping). This is the most common treatment for fractures in which the bone has not moved out of place (nondisplaced fracture).  Wearing a shoe that has a wide, rigid sole to protect the toe and to limit its movement.  Wearing a walking cast.  Having a procedure to move the toe back into place.  Surgery. This may be needed:  If there are many pieces of broken bone that are out of place (displaced).  If the toe joint breaks.  If the bone breaks through the skin.  Physical therapy. This is done to help regain movement and strength in the toe. You may need follow-up X-rays to make sure that the bone is healing well and staying in position. HOME CARE  INSTRUCTIONS If You Have a Cast:  Do not stick anything inside the cast to scratch your skin. Doing that increases your risk of infection.  Check the skin around the cast every day. Report any concerns to your health care provider. You may put lotion on dry skin around the edges of the cast. Do not apply lotion to the skin underneath the cast.  Do not put pressure on any part of the cast until it is fully hardened. This may take several hours.  Keep the cast clean and dry. Bathing  Do not take baths, swim, or use a hot tub until your health care provider approves. Ask your health care provider if you can take showers. You may only be allowed to take sponge baths for bathing.  If your health care provider approves bathing and showering, cover the cast or bandage (dressing) with a watertight plastic bag to protect it from water. Do not let the cast or dressing get wet. Managing Pain, Stiffness, and Swelling  If you do not have a cast, apply ice to the injured area, if directed.  Put ice in a plastic bag.  Place a towel between your skin and the bag.  Leave the ice on for 20 minutes, 2-3 times per day.  Move your toes often to avoid stiffness and to lessen swelling.  Raise (elevate) the injured area above the level of your heart while you are sitting  or lying down. Driving  Do not drive or operate heavy machinery while taking pain medicine.  Do not drive while wearing a cast on a foot that you use for driving. Activity  Return to your normal activities as directed by your health care provider. Ask your health care provider what activities are safe for you.  Perform exercises daily as directed by your health care provider or physical therapist. Safety  Do not use the injured limb to support your body weight until your health care provider says that you can. Use crutches or other assistive devices as directed by your health care provider. General Instructions  If your toe was  treated with buddy taping, follow your health care provider's instructions for changing the gauze and tape. Change it more often:  The gauze and tape get wet. If this happens, dry the space between the toes.  The gauze and tape are too tight and cause your toe to become pale or numb.  Wear a protective shoe as directed by your health care provider. If you were not given a protective shoe, wear sturdy, supportive shoes. Your shoes should not pinch your toes and should not fit tightly against your toes.  Do not use any tobacco products, including cigarettes, chewing tobacco, or e-cigarettes. Tobacco can delay bone healing. If you need help quitting, ask your health care provider.  Take medicines only as directed by your health care provider.  Keep all follow-up visits as directed by your health care provider. This is important. SEEK MEDICAL CARE IF:  You have a fever.  Your pain medicine is not helping.  Your toe is cold.  Your toe is numb.  You still have pain after one week of rest and treatment.  You still have pain after your health care provider has said that you can start walking again.  You have pain, tingling, or numbness in your foot that is not going away. SEEK IMMEDIATE MEDICAL CARE IF:  You have severe pain.  You have redness or inflammation in your toe that is getting worse.  You have pain or numbness in your toe that is getting worse.  Your toe turns blue.   This information is not intended to replace advice given to you by your health care provider. Make sure you discuss any questions you have with your health care provider.   Document Released: 12/19/1999 Document Revised: 09/11/2014 Document Reviewed: 10/17/2013 Elsevier Interactive Patient Education 2016 [place] Contusion A foot contusion is a deep bruise to the foot. Contusions are the result of an injury that caused bleeding under the skin. The contusion may turn blue, purple, or yellow.  Minor injuries will give you a painless contusion, but more severe contusions may stay painful and swollen for a few weeks. CAUSES  A foot contusion comes from a direct blow to that area, such as a heavy object falling on the foot. SYMPTOMS   Swelling of the foot.  Discoloration of the foot.  Tenderness or soreness of the foot. DIAGNOSIS  You will have a physical exam and will be asked about your history. You may need an X-ray of your foot to look for a broken bone (fracture).  TREATMENT  An elastic wrap may be recommended to support your foot. Resting, elevating, and applying cold compresses to your foot are often the best treatments for a foot contusion. Over-the-counter medicines may also be recommended for pain control. HOME CARE INSTRUCTIONS   Put ice on the injured area.  Put ice in a plastic bag.  Place a towel between your skin and the bag.  Leave the ice on for 15-20 minutes, 03-04 times a day.  Only take over-the-counter or prescription medicines for pain, discomfort, or fever as directed by your caregiver.  If told, use an elastic wrap as directed. This can help reduce swelling. You may remove the wrap for sleeping, showering, and bathing. If your toes become numb, cold, or blue, take the wrap off and reapply it more loosely.  Elevate your foot with pillows to reduce swelling.  Try to avoid standing or walking while the foot is painful. Do not resume use until instructed by your caregiver. Then, begin use gradually. If pain develops, decrease use. Gradually increase activities that do not cause discomfort until you have normal use of your foot.  See your caregiver as directed. It is very important to keep all follow-up appointments in order to avoid any lasting problems with your foot, including long-term (chronic) pain. SEEK IMMEDIATE MEDICAL CARE IF:   You have increased redness, swelling, or pain in your foot.  Your swelling or pain is not relieved with  medicines.  You have loss of feeling in your foot or are unable to move your toes.  Your foot turns cold or blue.  You have pain when you move your toes.  Your foot becomes warm to the touch.  Your contusion does not improve in 2 days. MAKE SURE YOU:   Understand these instructions.  Will watch your condition.  Will get help right away if you are not doing well or get worse.   This information is not intended to replace advice given to you by your health care provider. Make sure you discuss any questions you have with your health care provider.   Document Released: 10/12/2005 Document Revised: 06/22/2011 Document Reviewed: 08/27/2014 Elsevier Interactive Patient Education [employer].

## 2015-08-08 ENCOUNTER — Encounter: Payer: Self-pay | Admitting: Sports Medicine

## 2015-08-08 ENCOUNTER — Ambulatory Visit (INDEPENDENT_AMBULATORY_CARE_PROVIDER_SITE_OTHER): Payer: Medicare HMO

## 2015-08-08 ENCOUNTER — Ambulatory Visit (INDEPENDENT_AMBULATORY_CARE_PROVIDER_SITE_OTHER): Payer: Medicare HMO | Admitting: Sports Medicine

## 2015-08-08 DIAGNOSIS — M79671 Pain in right foot: Secondary | ICD-10-CM

## 2015-08-08 DIAGNOSIS — S93601A Unspecified sprain of right foot, initial encounter: Secondary | ICD-10-CM

## 2015-08-08 DIAGNOSIS — S92911A Unspecified fracture of right toe(s), initial encounter for closed fracture: Secondary | ICD-10-CM | POA: Diagnosis not present

## 2015-08-08 MED ORDER — IBUPROFEN 800 MG PO TABS: 800.0000 mg | ORAL_TABLET | Freq: Two times a day (BID) | ORAL | 0 refills | Status: DC

## 2015-08-08 NOTE — Progress Notes (Signed)
Subjective: Monica Mcclure is a 60 y.o. female patient who presents to office for evaluation of Right foot pain. Patient complains of continued pain at right 5th toe after stub injury. Went to ER on July 18 where xray was done and was told it was fractured and given special shoe. States she can not tolerate shoe because of size and feeling unsteady. States that she has been taping toe however still hurts with bending. Patient denies any other pedal complaints.   There are no active problems to display for this patient.   Current Outpatient Prescriptions on File Prior to Visit  Medication Sig Dispense Refill  . clonazePAM (KLONOPIN) 0.5 MG tablet Take 0.5 mg by mouth 2 (two) times daily.    . Diethylpropion HCl 25 MG TABS Take 1 tablet by mouth 3 (three) times daily.  2  . escitalopram (LEXAPRO) 20 MG tablet Take 20 mg by mouth daily.    . fentaNYL (DURAGESIC - DOSED MCG/HR) 75 MCG/HR APPLY 1 PATCH(ES) EVERY 72 HOURS BY TRANSDERMAL ROUTE FOR 30 DAYS.  0  . gabapentin (NEURONTIN) 600 MG tablet Take 600 mg by mouth 4 (four) times daily.    Marland Kitchen gabapentin (NEURONTIN) 800 MG tablet Take 800 mg by mouth 3 (three) times daily.    Marland Kitchen HYDROmorphone (DILAUDID) 4 MG tablet Take by mouth 4 (four) times daily.    Marland Kitchen lisinopril-hydrochlorothiazide (PRINZIDE,ZESTORETIC) 10-12.5 MG per tablet Take 1 tablet by mouth 2 (two) times daily.    Marland Kitchen oxymorphone (OPANA ER) 40 MG 12 hr tablet Take 40 mg by mouth every 12 (twelve) hours.     No current facility-administered medications on file prior to visit.     Allergies  Allergen Reactions  . Shellfish Allergy Other (See Comments) and Anaphylaxis    "makes my throat close"  . Iodinated Diagnostic Agents Hives    "Some food dyes"  . Other Hives    SSRI's SSRI's  . Tramadol Swelling  . Serzone [Nefazodone] Rash    Objective:  General: Alert and oriented x3 in no acute distress  Dermatology: No open lesions bilateral lower extremities, no webspace  macerations, no ecchymosis bilateral, all nails x 10 are well manicured.  Vascular: Focal edema noted to right 5th toe. Dorsalis Pedis and Posterior Tibial pedal pulses 2/4, Capillary Fill Time 3 seconds,(+) pedal hair growth bilateral, Temperature gradient within normal limits.  Neurology: Gross sensation intact via light touch bilateral,(- )Tinels sign right foot.   Musculoskeletal: There is tenderness with palpation at IPJ right 5th toe and with range of motion at MTPJ on right, No frank dislocation or hammertoe. Strength within normal limits in all groups bilateral.   Xrays  Right Foot   Impression:Small incomplete transverse non-displaced fracture at distal phalanx 5th toe, no other significant acute findings at area of concern.    Assessment and Plan: Problem List Items Addressed This Visit    None    Visit Diagnoses    Right foot pain    -  Primary   Relevant Medications   ibuprofen (ADVIL,MOTRIN) 800 MG tablet   Other Relevant Orders   DG Foot 2 Views Right   Toe fracture, right, closed, initial encounter       Sprain of foot, right, initial encounter          -Complete examination performed -Xrays reviewed -Discussed treatement options for right 5th toe fracture with likely sprain; risks, alternatives, and benefits explained. -Applied Toe splint and instructed patient on how to properly splint at  home -Recommend post op shoe of which she has or stiff sole shoe -Recommend protection, rest, ice, elevation daily until symptoms improve -Rx Motrin -Patient to return to office in 4 weeks for serial x-rays to assess healing  or sooner if condition worsens.  Landis Martins, DPM

## 2015-08-29 ENCOUNTER — Other Ambulatory Visit: Payer: Self-pay | Admitting: Sports Medicine

## 2015-08-29 ENCOUNTER — Encounter: Payer: Self-pay | Admitting: Sports Medicine

## 2015-08-29 ENCOUNTER — Ambulatory Visit (INDEPENDENT_AMBULATORY_CARE_PROVIDER_SITE_OTHER): Payer: Medicare HMO

## 2015-08-29 ENCOUNTER — Telehealth: Payer: Self-pay | Admitting: Sports Medicine

## 2015-08-29 ENCOUNTER — Ambulatory Visit (INDEPENDENT_AMBULATORY_CARE_PROVIDER_SITE_OTHER): Payer: Medicare HMO | Admitting: Sports Medicine

## 2015-08-29 DIAGNOSIS — M79671 Pain in right foot: Secondary | ICD-10-CM

## 2015-08-29 DIAGNOSIS — S93601D Unspecified sprain of right foot, subsequent encounter: Secondary | ICD-10-CM

## 2015-08-29 DIAGNOSIS — S92911A Unspecified fracture of right toe(s), initial encounter for closed fracture: Secondary | ICD-10-CM | POA: Diagnosis not present

## 2015-08-29 DIAGNOSIS — S92911D Unspecified fracture of right toe(s), subsequent encounter for fracture with routine healing: Secondary | ICD-10-CM

## 2015-08-29 DIAGNOSIS — L600 Ingrowing nail: Secondary | ICD-10-CM | POA: Diagnosis not present

## 2015-08-29 DIAGNOSIS — S93601A Unspecified sprain of right foot, initial encounter: Secondary | ICD-10-CM | POA: Diagnosis not present

## 2015-08-29 NOTE — Telephone Encounter (Signed)
ERROR

## 2015-08-30 NOTE — Progress Notes (Addendum)
Subjective: Monica Mcclure is a 60 y.o. female patient who returns to office for evaluation of Right 5th toe fracture. Reports has been wrapping toe until she ran out. States that pain is getting better and that she is interested in future having ingrown nails taken care of. Patient denies any other pedal complaints.   There are no active problems to display for this patient.   Current Outpatient Prescriptions on File Prior to Visit  Medication Sig Dispense Refill  . clonazePAM (KLONOPIN) 0.5 MG tablet Take 0.5 mg by mouth 2 (two) times daily.    . Diethylpropion HCl 25 MG TABS Take 1 tablet by mouth 3 (three) times daily.  2  . escitalopram (LEXAPRO) 20 MG tablet Take 20 mg by mouth daily.    . fentaNYL (DURAGESIC - DOSED MCG/HR) 75 MCG/HR APPLY 1 PATCH(ES) EVERY 72 HOURS BY TRANSDERMAL ROUTE FOR 30 DAYS.  0  . gabapentin (NEURONTIN) 600 MG tablet Take 600 mg by mouth 4 (four) times daily.    Marland Kitchen gabapentin (NEURONTIN) 800 MG tablet Take 800 mg by mouth 3 (three) times daily.    Marland Kitchen HYDROmorphone (DILAUDID) 4 MG tablet Take by mouth 4 (four) times daily.    Marland Kitchen ibuprofen (ADVIL,MOTRIN) 800 MG tablet Take 1 tablet (800 mg total) by mouth 2 (two) times daily. 60 tablet 0  . lisinopril-hydrochlorothiazide (PRINZIDE,ZESTORETIC) 10-12.5 MG per tablet Take 1 tablet by mouth 2 (two) times daily.    Marland Kitchen oxymorphone (OPANA ER) 40 MG 12 hr tablet Take 40 mg by mouth every 12 (twelve) hours.     No current facility-administered medications on file prior to visit.     Allergies  Allergen Reactions  . Shellfish Allergy Other (See Comments) and Anaphylaxis    "makes my throat close"  . Iodinated Diagnostic Agents Hives    "Some food dyes"  . Other Hives    SSRI's SSRI's  . Tramadol Swelling  . Serzone [Nefazodone] Rash    Objective:  General: Alert and oriented x3 in no acute distress  Dermatology: No open lesions bilateral lower extremities, no webspace macerations, no ecchymosis bilateral, all  nails x 10 are thickened but well manicured with mild incurvation at medial hallux bilateral that patient desires to have procedure on in future. No acute signs of infection.  Vascular: Focal edema noted to right 5th toe. Dorsalis Pedis and Posterior Tibial pedal pulses 2/4, Capillary Fill Time 3 seconds,(+) pedal hair growth bilateral, Temperature gradient within normal limits.  Neurology: Gross sensation intact via light touch bilateral,(- )Tinels sign right foot.   Musculoskeletal: There is tenderness with palpation at IPJ right 5th toe and with range of motion at MTPJ on right, No frank dislocation or hammertoe. Strength within normal limits in all groups bilateral.   Xrays  Right Foot   Impression:Small incomplete transverse non-displaced fracture at distal phalanx 5th toe resolved with now more apparent chip fracture at base of proximal phalanx medial aspect, no other significant acute findings at area of concern.    Assessment and Plan: Problem List Items Addressed This Visit    None    Visit Diagnoses    Right foot pain    -  Primary   Relevant Orders   DG Foot Complete Right   Toe fracture, right, with routine healing, subsequent encounter       Relevant Orders   DG Foot Complete Right   Sprain of foot, right, subsequent encounter       Relevant Orders   DG  Foot Complete Right   Ingrown nail       Medial hallux bilateral      -Complete examination performed -Xrays reviewed -Discussed continued care for right 5th toe fracture with sprain; risks, alternatives, and benefits explained. -Applied Toe splint and instructed patient on how to properly splint at home -Recommend and strongly advised compliance with post op shoe of which she has or stiff sole shoe to prevent worsening of fracture -Recommend protection, rest, ice, elevation daily until symptoms improve -Continue with Motrin -Patient to return to office in 4 weeks for serial x-rays to assess healing and for ingrown  nail procedure or sooner if condition worsens.  Landis Martins, DPM

## 2015-09-12 ENCOUNTER — Ambulatory Visit: Payer: Medicare HMO | Admitting: Sports Medicine

## 2015-09-18 ENCOUNTER — Other Ambulatory Visit: Payer: Self-pay | Admitting: Sports Medicine

## 2015-09-18 DIAGNOSIS — M79671 Pain in right foot: Secondary | ICD-10-CM

## 2015-10-03 ENCOUNTER — Ambulatory Visit (INDEPENDENT_AMBULATORY_CARE_PROVIDER_SITE_OTHER): Payer: Medicare HMO

## 2015-10-03 ENCOUNTER — Ambulatory Visit (INDEPENDENT_AMBULATORY_CARE_PROVIDER_SITE_OTHER): Payer: Medicare HMO | Admitting: Podiatry

## 2015-10-03 DIAGNOSIS — R52 Pain, unspecified: Secondary | ICD-10-CM

## 2015-10-03 DIAGNOSIS — L600 Ingrowing nail: Secondary | ICD-10-CM

## 2015-10-03 DIAGNOSIS — M79676 Pain in unspecified toe(s): Secondary | ICD-10-CM | POA: Diagnosis not present

## 2015-10-03 DIAGNOSIS — L03039 Cellulitis of unspecified toe: Secondary | ICD-10-CM | POA: Diagnosis not present

## 2015-10-03 NOTE — Progress Notes (Signed)
Subjective: Patient presents today for pain in the bilateral great toes. Patient's concerned that her toenails are growing into her skin on the medial aspects of her great toes. Patient states that the pain is most prominent on the medial borders of the bilateral great toes. Patient presents today further treatment and evaluation  There are no active problems to display for this patient.   Allergies  Allergen Reactions  . Shellfish Allergy Other (See Comments) and Anaphylaxis    "makes my throat close"  . Iodinated Diagnostic Agents Hives    "Some food dyes"  . Other Hives    SSRI's SSRI's  . Tramadol Swelling  . Serzone [Nefazodone] Rash    Objective:  General: Well developed, nourished, in no acute distress, alert and oriented x3   Dermatology: Skin is warm, dry and supple bilateral. The medial borders of the bilateral hallux nail appears to be  severely incurvated with hyperkeratosis formation at the distal aspects of  the medial and lateral nail border. The remaining nails appear unremarkable at this time. There are no open sores, lesions.  Vascular: Dorsalis Pedis artery and Posterior Tibial artery pedal pulses palpable. No lower extremity edema noted.   Neruologic: Grossly intact via light touch bilateral.  Musculoskeletal: Tenderness to palpation of the bilateral great toes medial nail fold(s). Muscular strength within normal limits in all groups bilateral.   Assesement: #1 paronychia bilateral great toes medial border #2 pain in bilateral great toes #3 onychocryptosis bilateral great toes medial border   Plan of Care:  1. Patient evaluated.  2. Discussed treatment alternatives and plan of care; Explained nail avulsion procedure and post procedure course to patient. 3. Patient opted for partial nail avulsion of bilateral great toes.  4. Prior to procedure, local anesthesia infiltration utilized using 3 ml of a 50:50 mixture of 2% plain lidocaine and 0.5% plain  marcaine in a normal hallux block fashion and a betadine prep performed.  5. Partial permanent nail avulsion performed including the respective nail matrix using [id number] applications of phenol followed by alcohol flush on the.  6. Light dressing applied. 7. Return to clinic in 2 weeks.   Edrick Kins, DPM

## 2015-10-03 NOTE — Patient Instructions (Signed)

## 2015-10-17 ENCOUNTER — Ambulatory Visit (INDEPENDENT_AMBULATORY_CARE_PROVIDER_SITE_OTHER): Payer: Medicare HMO | Admitting: Podiatry

## 2015-10-17 ENCOUNTER — Encounter: Payer: Self-pay | Admitting: Podiatry

## 2015-10-17 DIAGNOSIS — S91109D Unspecified open wound of unspecified toe(s) without damage to nail, subsequent encounter: Secondary | ICD-10-CM

## 2015-10-17 DIAGNOSIS — L89891 Pressure ulcer of other site, stage 1: Secondary | ICD-10-CM | POA: Diagnosis not present

## 2015-10-17 DIAGNOSIS — L03039 Cellulitis of unspecified toe: Secondary | ICD-10-CM

## 2015-10-17 DIAGNOSIS — M79676 Pain in unspecified toe(s): Secondary | ICD-10-CM

## 2015-10-19 NOTE — Progress Notes (Signed)

## 2016-03-24 ENCOUNTER — Ambulatory Visit: Payer: Medicare HMO | Admitting: Advanced Practice Midwife

## 2016-04-29 IMAGING — US US SOFT TISSUE HEAD/NECK
1 series · 14 of 25 positions shown · non-contrast
Comparison: None.

CLINICAL DATA: Right thyroid nodule noted on CT

EXAM:
THYROID ULTRASOUND
TECHNIQUE: Ultrasound examination of the thyroid gland and adjacent soft
tissues was performed.

[Series 1: us soft tissue head/neck · 0.08mm/px · 14 of 61 slices shown]
[im 1/61]
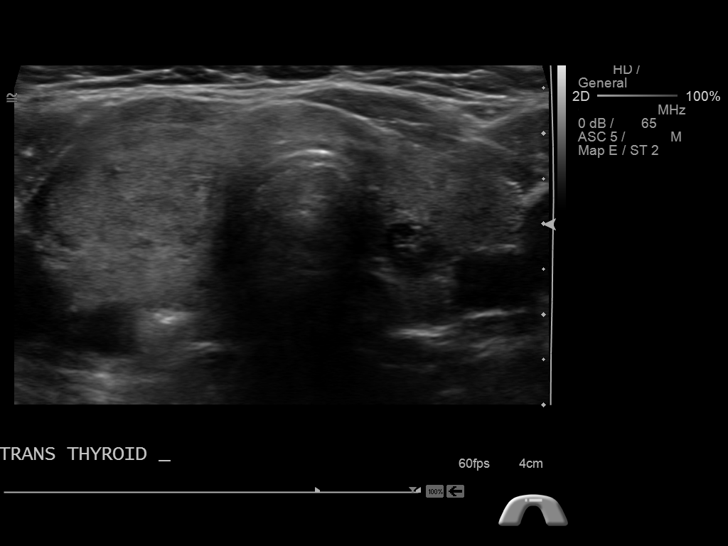
[im 6/61]
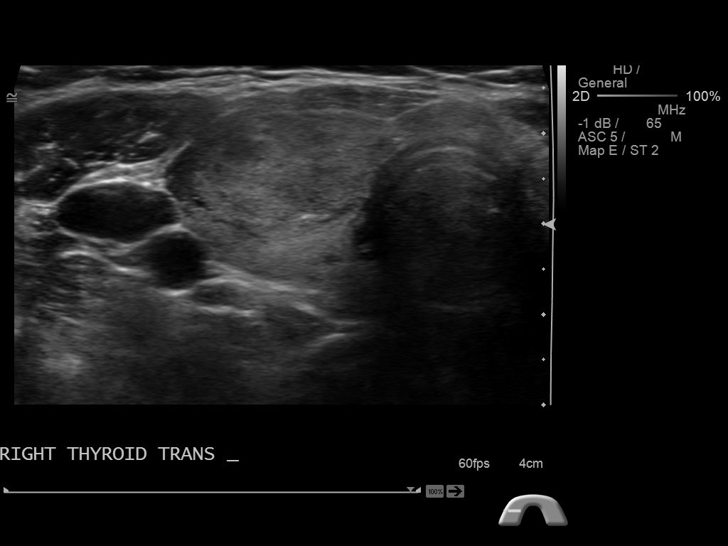
[im 11/61]
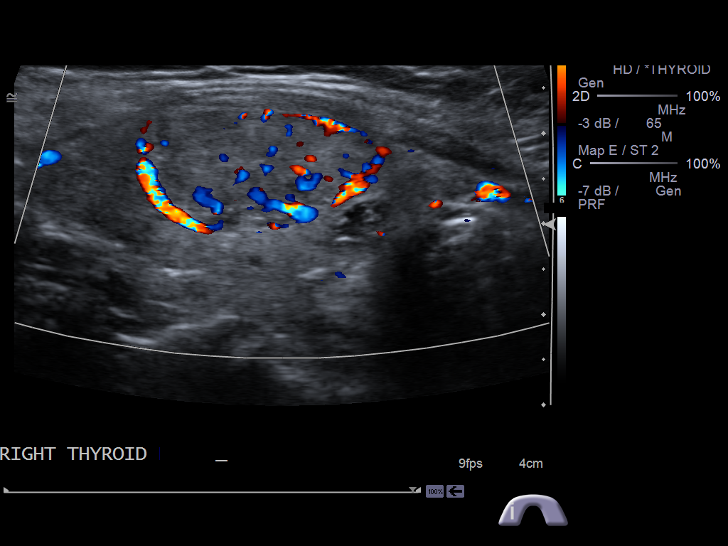
[im 16/61]
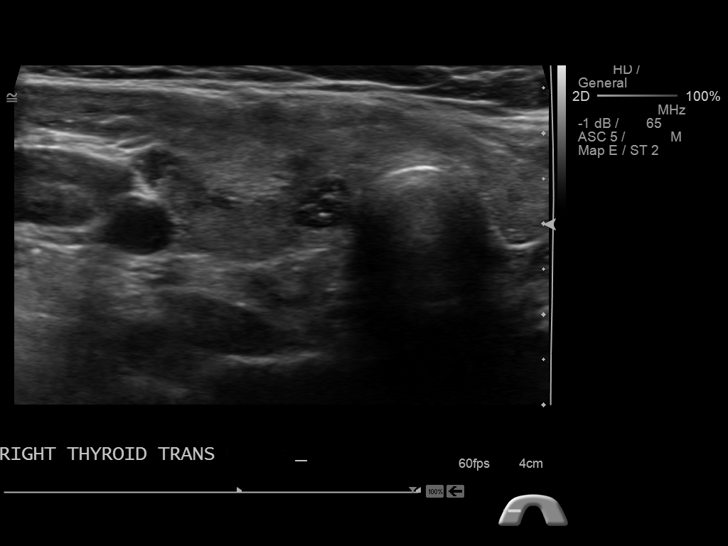
[im 21/61]
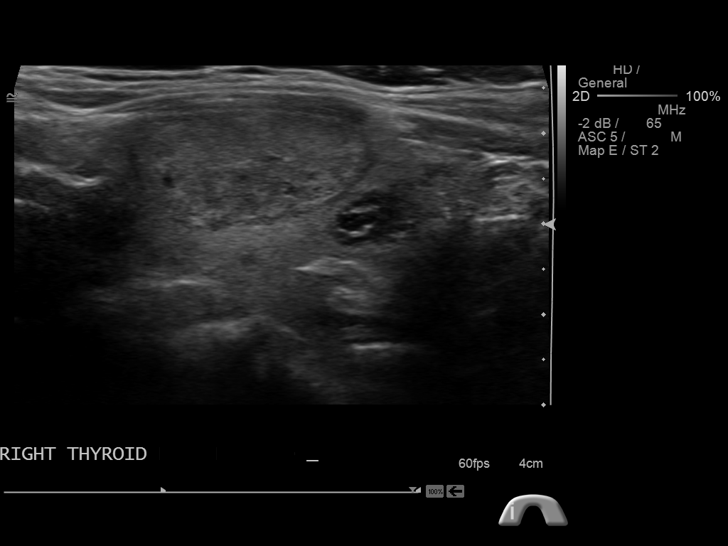
[im 23/61]
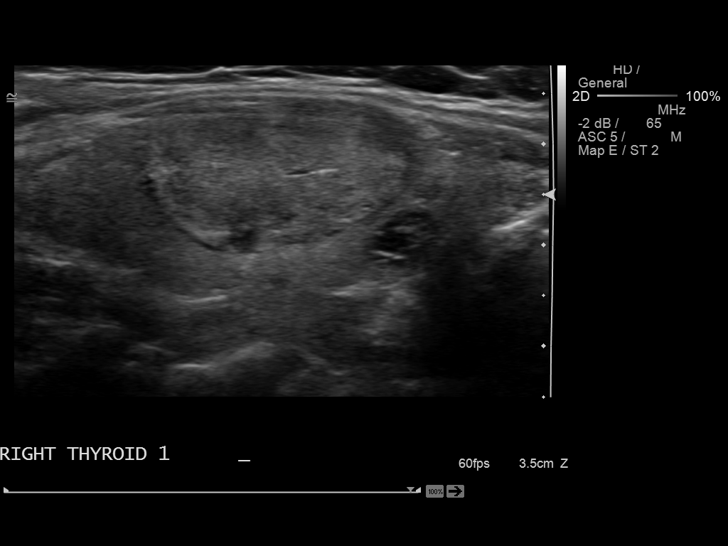
[im 28/61]
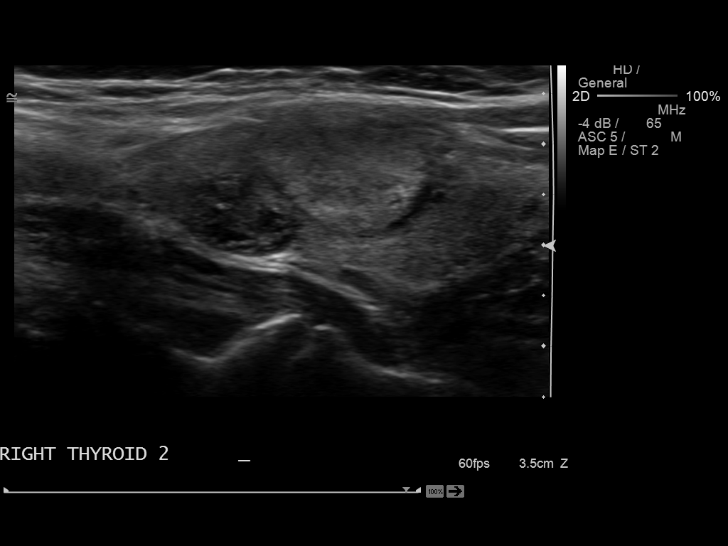
[im 33/61]
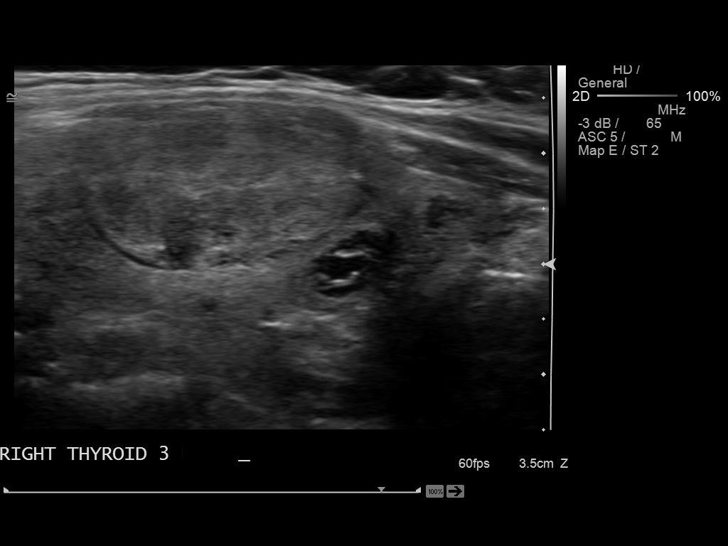
[im 38/61]
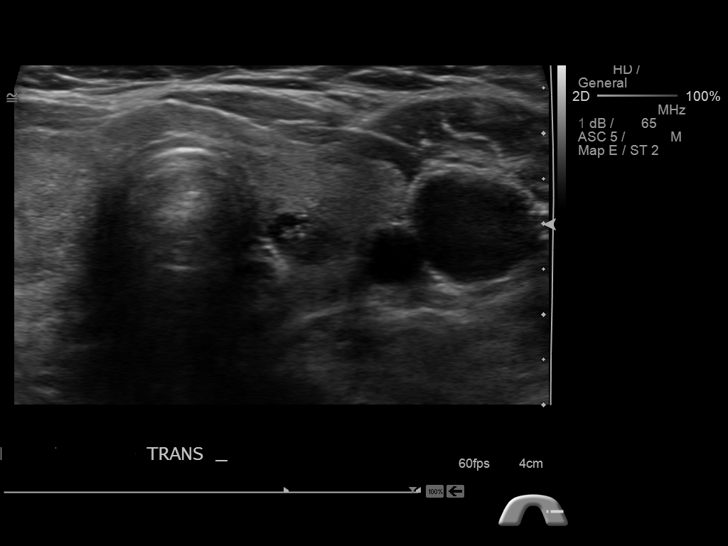
[im 41/61]
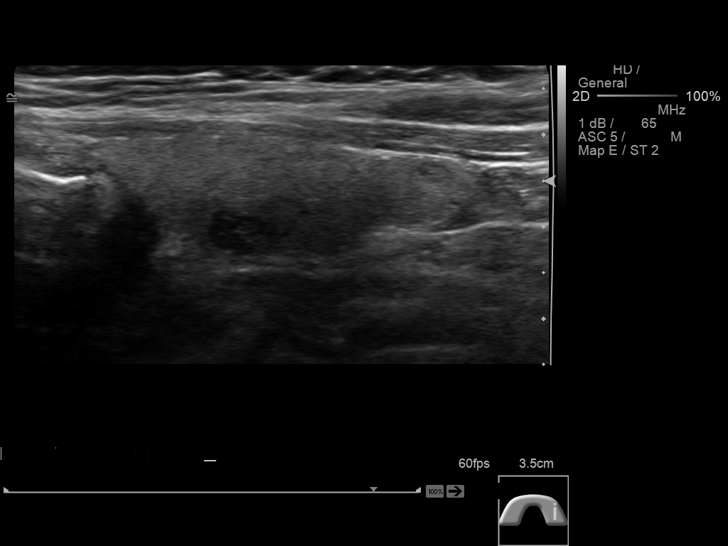
[im 46/61]
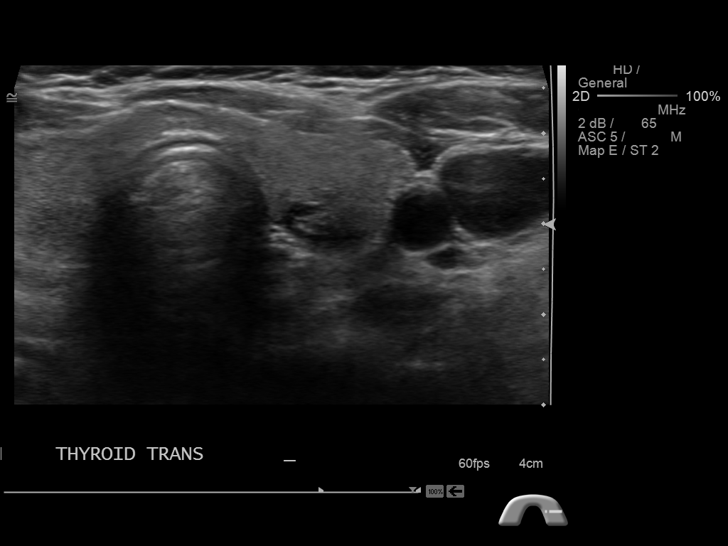
[im 51/61]
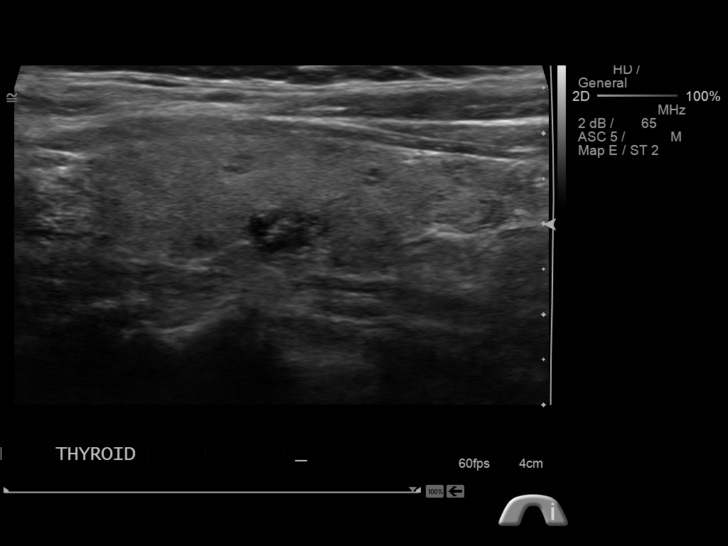
[im 56/61]
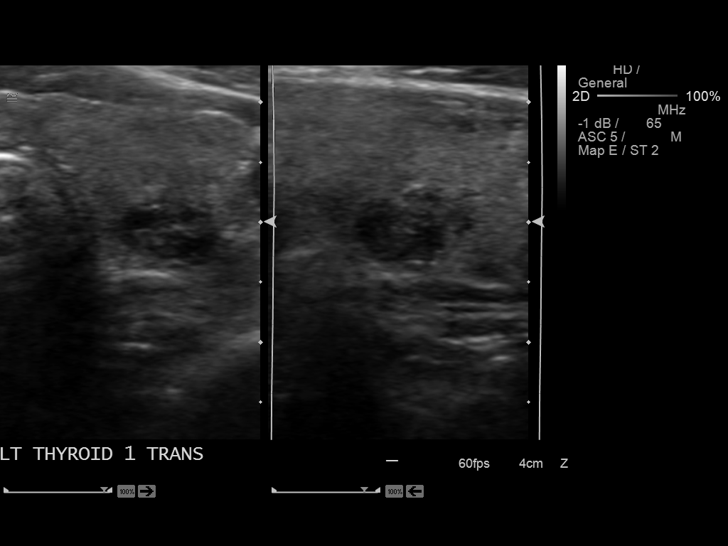
[im 61/61]
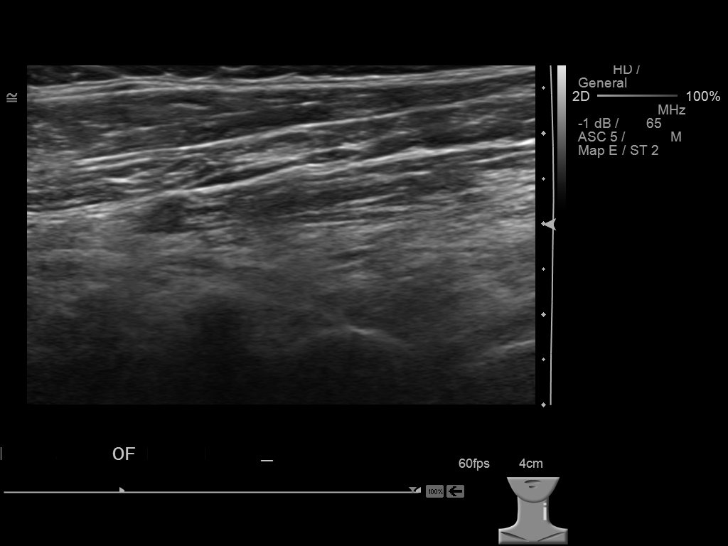

[14 of 25 positions shown; findings below may reference images not displayed]

FINDINGS: Right thyroid lobe

Measurements: 4.8 x 2.0 x 2.1 cm. Dominant hypoechoic nodule with
markedly increased vascularity measures 2.6 x 2.0 x 1.4 cm. Upper
pole nodule measures 11 x 8 x 7 mm. Small sub cm lower pole nodule.

Left thyroid lobe

Measurements: 4.0 x 1.8 x 1.6 cm. Dominant mid lobe nodule measures
10 x 9 x 6 mm. Other smaller sub cm nodules are also present.

Isthmus

Thickness: 5 mm.  No nodules visualized.

Lymphadenopathy

None visualized.
IMPRESSION: Multiple bilateral nodules. Dominant 2.6 cm right lobe nodule
corresponds to the CT abnormality and meets criteria for fine needle
aspiration. Findings meet consensus criteria for biopsy.
Ultrasound-guided fine needle aspiration should be considered, as
per the consensus statement: Management of Thyroid Nodules Detected
at US: Society of Radiologists in Ultrasound Consensus Conference

## 2016-09-28 IMAGING — US US SOFT TISSUE HEAD/NECK
1 series · 13 of 25 positions shown · non-contrast
Comparison: Prior thyroid ultrasound 08/22/2014

CLINICAL DATA: 59-year-old female with thyroid nodules. Prior
ultrasound-guided biopsy of the dominant right-sided thyroid nodule
10/04/2014 and 10/22/2014

EXAM:
THYROID ULTRASOUND
TECHNIQUE: Ultrasound examination of the thyroid gland and adjacent soft
tissues was performed.

[Series 1: us soft tissue head/neck · 0.07mm/px · 13 of 80 slices shown]
[im 1/80]
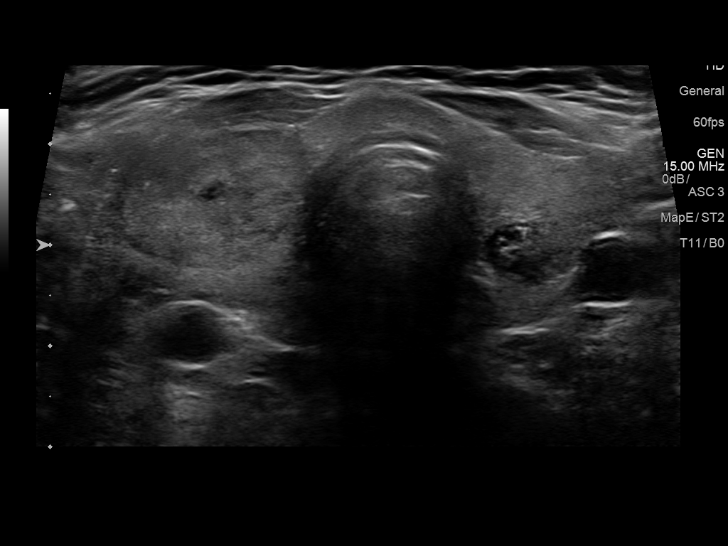
[im 7/80]
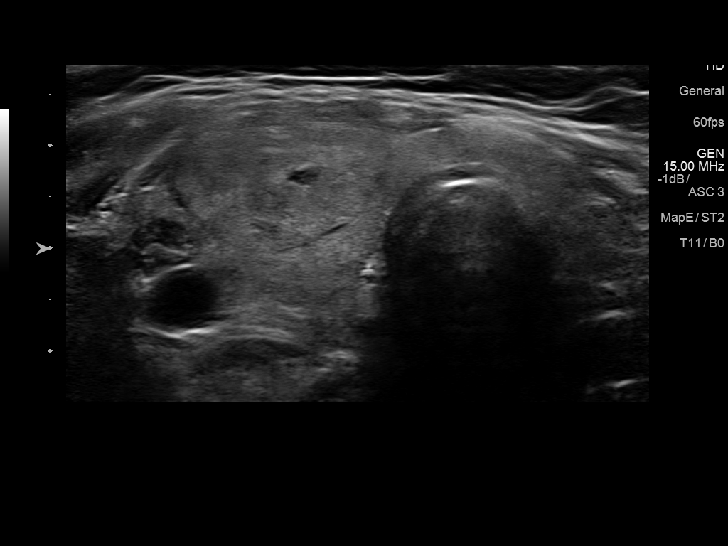
[im 14/80]
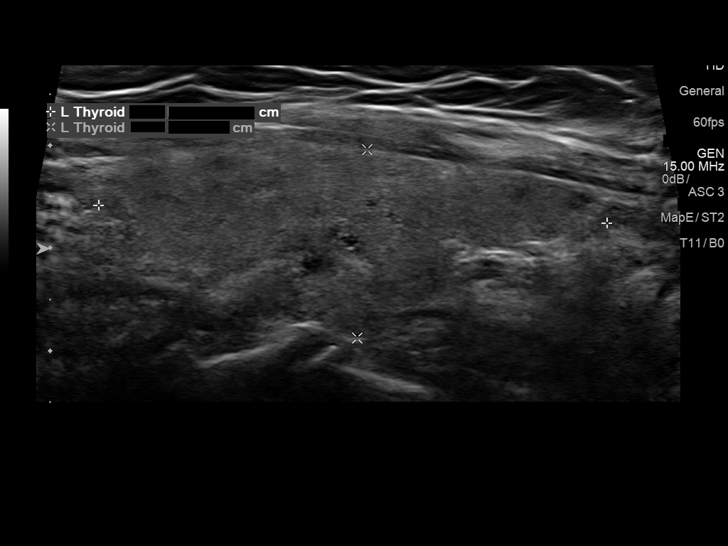
[im 20/80]
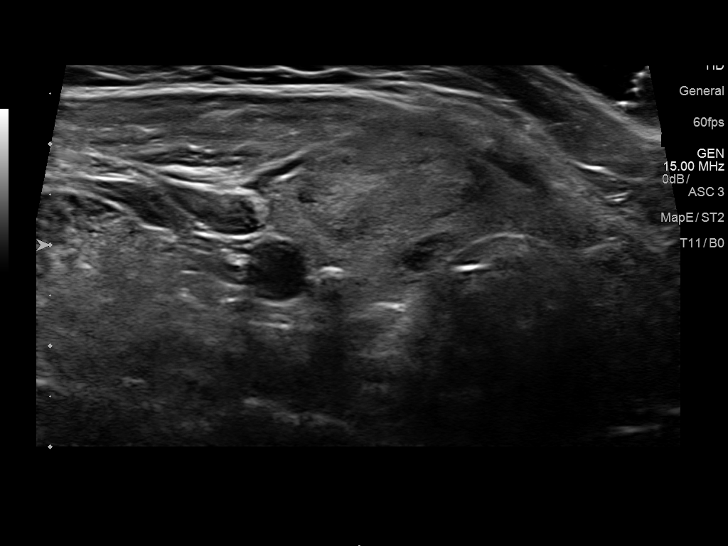
[im 27/80]
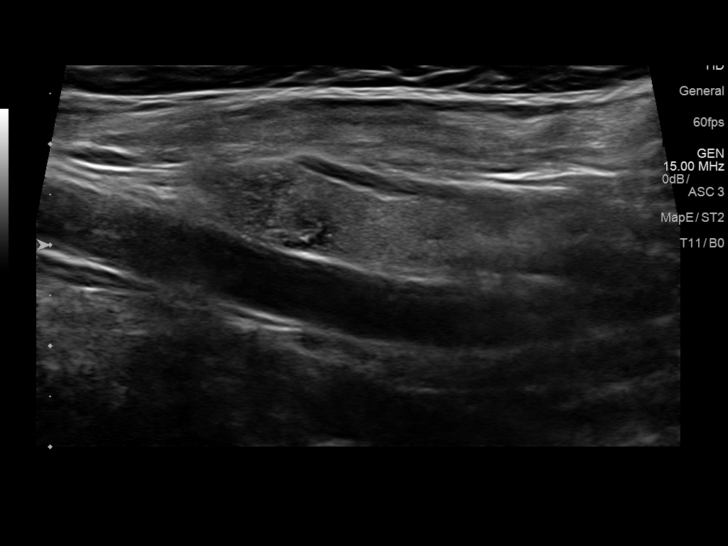
[im 33/80]
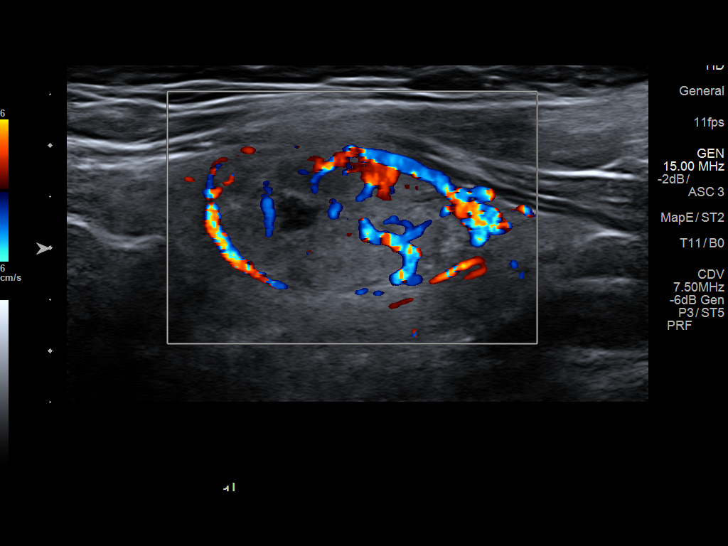
[im 40/80]
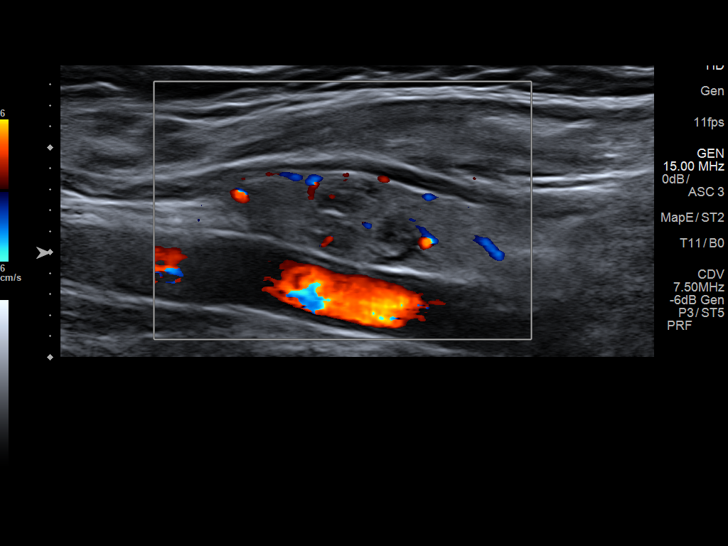
[im 47/80]
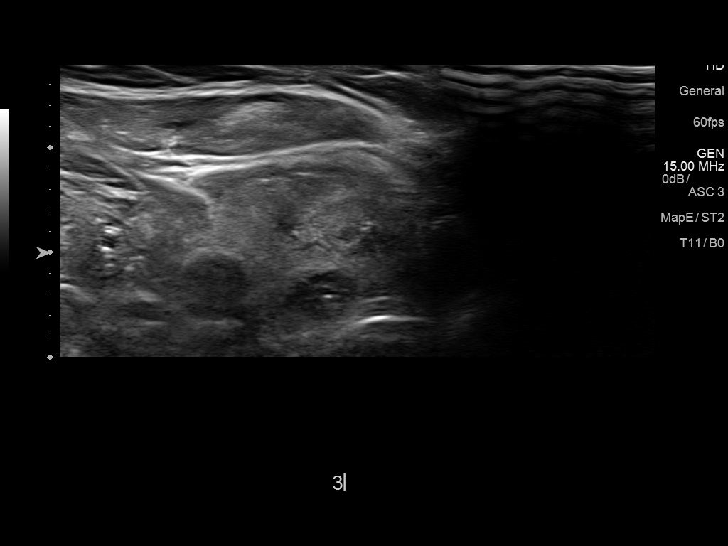
[im 53/80]
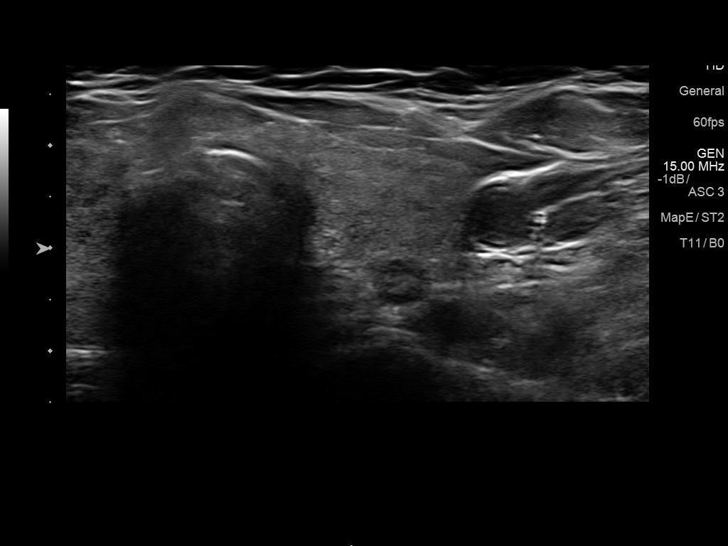
[im 60/80]
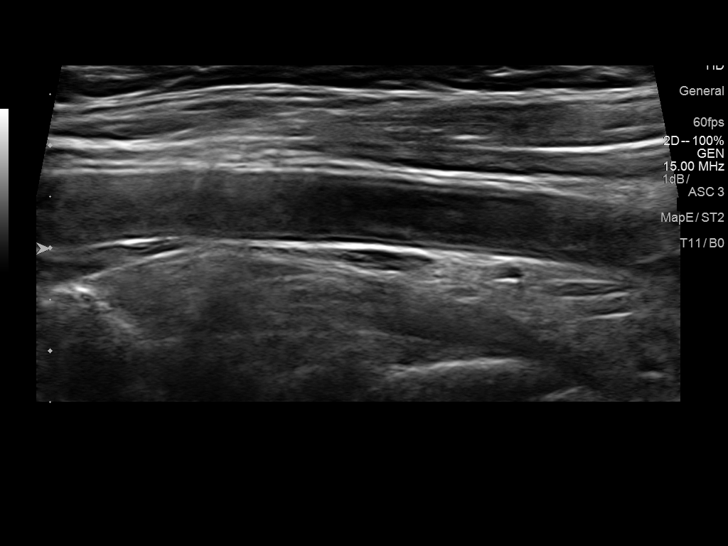
[im 66/80]
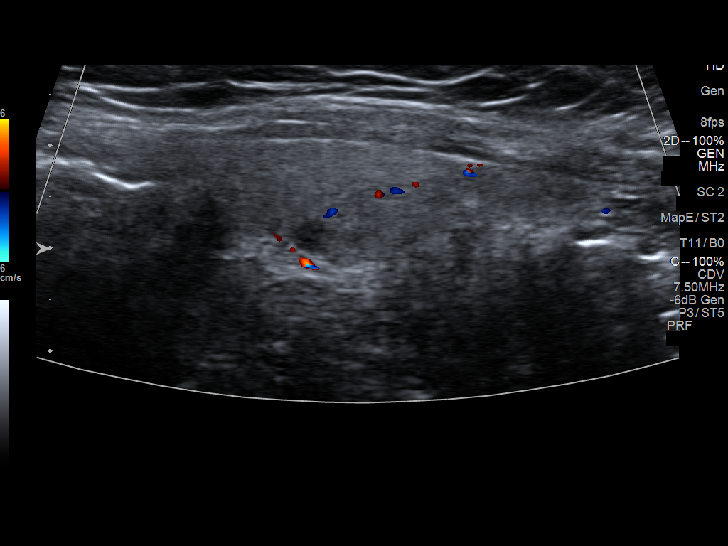
[im 73/80]
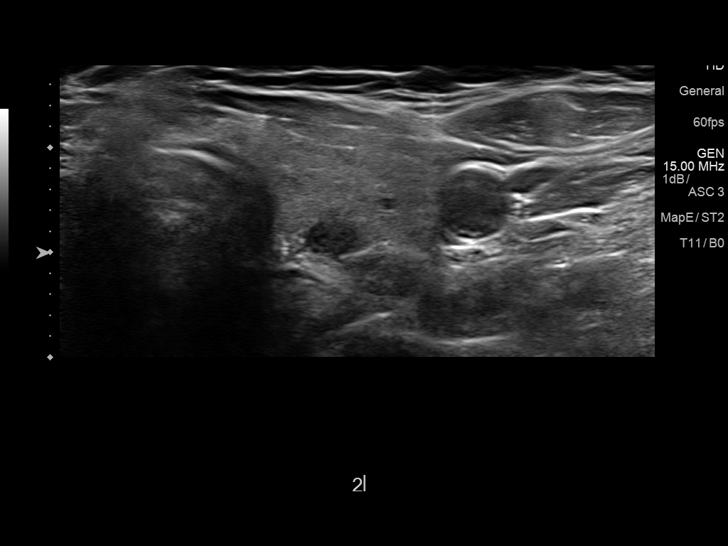
[im 80/80]
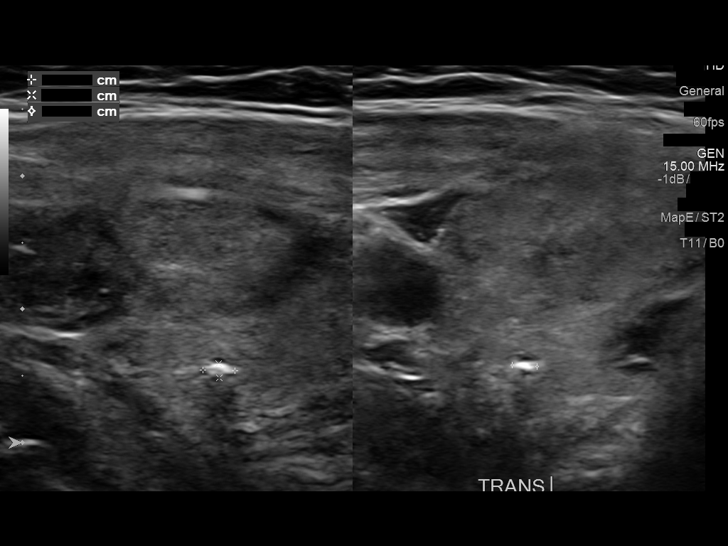

[13 of 25 positions shown; findings below may reference images not displayed]

FINDINGS: Right thyroid lobe

Measurements: 5.7 x 2.4 x 2.6 cm. Diffusely heterogeneous thyroid
gland containing multiple nodules.

1. Dominant previously biopsied nodule in the upper to mid gland
measures 26 x 15 x 22 mm which is essentially unchanged compared to
prior imaging.
2. Heterogeneous solid hypoechoic nodule with internal
microcalcifications in the lateral aspect of the mid gland measures
11 x 7 x 8 mm which remains unchanged.
3. Hypoechoic nodule in the deep aspect of the mid to lower gland
measures 9 x 6 x 8 mm, insignificantly changed.
Left thyroid lobe

Measurements: 5.0 x 1.8 x 1.6 cm. Heterogeneous thyroid gland with
multiple nodules.

1. Heterogeneous hypoechoic nodule in the lower gland measures 10 x
7 x 10 mm which is insignificantly changed compared to 10 x 6 x 9 mm
previously.
2. Hypoechoic solid nodule in the deep aspect of the mid gland
measures 7 x 4 x 6 mm which remains unchanged.
Isthmus

Thickness: 0.5 cm.  No nodules visualized.

Lymphadenopathy

None visualized.
IMPRESSION: No significant interval change in the size, number or configuration
of a bilateral thyroid nodules including the previously biopsied
dominant nodule in the right mid to upper gland.

## 2017-01-26 IMAGING — US US BREAST*R* LIMITED INC AXILLA
1 series · 1 of 1 positions shown · non-contrast
Comparison: Previous exams including recent screening mammogram
dated 06/24/2015.

CLINICAL DATA: Patient returns today to evaluate a possible
asymmetry questioned within the retroareolar right breast on recent
screening mammogram.

EXAM:
2D DIGITAL DIAGNOSTIC RIGHT MAMMOGRAM WITH CAD AND ADJUNCT TOMO
ULTRASOUND RIGHT BREAST

[Series 1: us breast*right* limited inc axilla · 0.05mm/px · 1 of 1 slices shown]
[im 1/1]
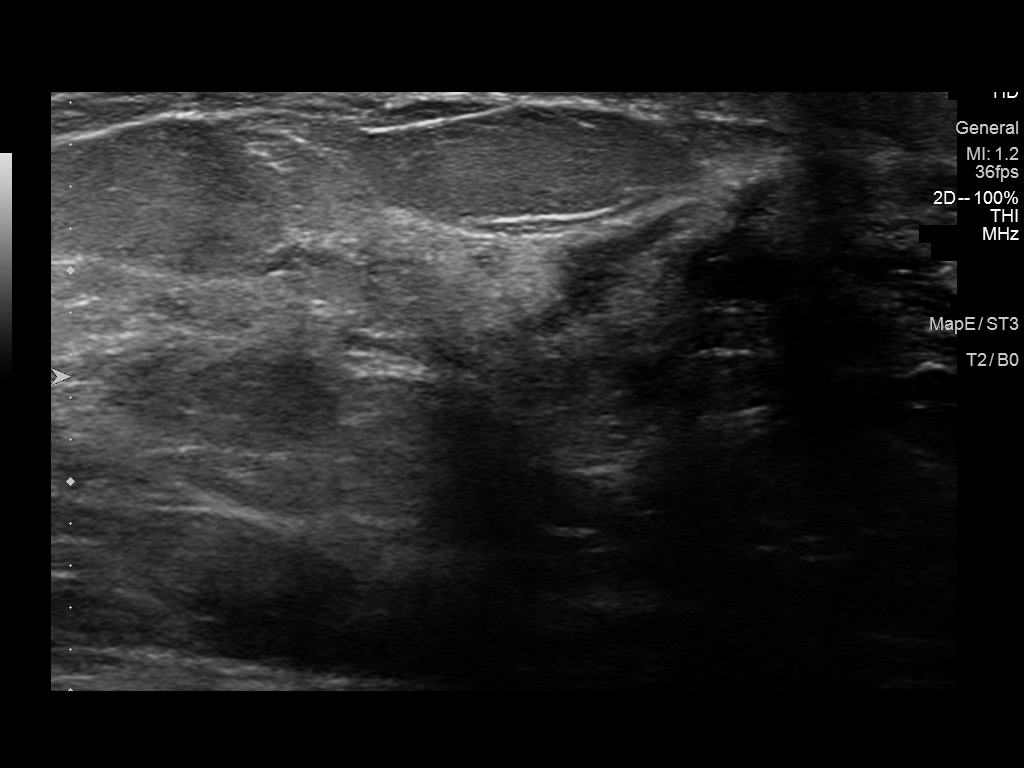

[1 of 1 positions shown; findings below may reference images not displayed]

ACR Breast Density Category b: There are scattered areas of
fibroglandular density.
FINDINGS: On today's additional views with spot compression and 3D
tomosynthesis, including true lateral view with 3D tomosynthesis,
the questioned asymmetry in the slightly outer retroareolar right
breast has an appearance compatible with superimposed normal
fibroglandular tissues. In particular, the overall fibroglandular
pattern on the true lateral view appears stable compared to the
earlier MLO view of 09/24/2010.

Mammographic images were processed with CAD.

On physical exam, no palpable abnormality is identified within the
retroareolar or periareolar right breast. Patient denies nipple
discharge.

Targeted ultrasound is performed, showing only normal fibroglandular
tissues interposed among multiple mildly dilated retroareolar ducts
which contain only avascular internal debris. No suspicious solid or
cystic masses are identified within the retroareolar right breast.
IMPRESSION: No evidence of malignancy. Benign duct ectasia and superimposition
of normal fibroglandular tissues within the retroareolar right
breast.

Patient may return to routine annual bilateral screening mammogram
schedule.

RECOMMENDATION:
Screening mammogram in one year.(Code:[id number])

The patient was instructed to return sooner if a palpable lump is
identified or if nipple discharge is noted.

I have discussed the findings and recommendations with the patient.
Results were also provided in writing at the conclusion of the
visit. If applicable, a reminder letter will be sent to the patient
regarding the next appointment.

BI-RADS CATEGORY  2: Benign.

## 2017-04-11 ENCOUNTER — Telehealth: Payer: Self-pay | Admitting: Internal Medicine

## 2017-04-11 NOTE — Telephone Encounter (Signed)
ERROR

## 2017-08-23 ENCOUNTER — Other Ambulatory Visit: Payer: Self-pay | Admitting: Family Medicine

## 2017-08-24 ENCOUNTER — Other Ambulatory Visit: Payer: Self-pay | Admitting: Family Medicine

## 2017-08-24 DIAGNOSIS — Z1231 Encounter for screening mammogram for malignant neoplasm of breast: Secondary | ICD-10-CM

## 2017-09-29 ENCOUNTER — Ambulatory Visit
Admission: RE | Admit: 2017-09-29 | Discharge: 2017-09-29 | Disposition: A | Discharge status: Home / Self Care | Payer: Medicare HMO | Source: Ambulatory Visit | Attending: Family Medicine | Admitting: Family Medicine

## 2017-09-29 DIAGNOSIS — Z1231 Encounter for screening mammogram for malignant neoplasm of breast: Secondary | ICD-10-CM | POA: Insufficient documentation

## 2019-08-01 ENCOUNTER — Other Ambulatory Visit: Payer: Self-pay

## 2019-08-01 ENCOUNTER — Encounter: Payer: Self-pay | Admitting: Emergency Medicine

## 2019-08-01 ENCOUNTER — Emergency Department: Payer: Medicare HMO

## 2019-08-01 ENCOUNTER — Emergency Department
Admission: EM | Admit: 2019-08-01 | Discharge: 2019-08-01 | Disposition: A | Discharge status: Home / Self Care | Payer: Medicare HMO | Attending: Emergency Medicine | Admitting: Emergency Medicine

## 2019-08-01 DIAGNOSIS — R0781 Pleurodynia: Secondary | ICD-10-CM | POA: Diagnosis present

## 2019-08-01 DIAGNOSIS — Z5321 Procedure and treatment not carried out due to patient leaving prior to being seen by health care provider: Secondary | ICD-10-CM | POA: Insufficient documentation

## 2019-08-01 LAB — BASIC METABOLIC PANEL
Anion gap: 8 (ref 5–15)
BUN: 21 mg/dL (ref 8–23)
CO2: 26 mmol/L (ref 22–32)
Calcium: 9.1 mg/dL (ref 8.9–10.3)
Chloride: 102 mmol/L (ref 98–111)
Creatinine, Ser: 0.81 mg/dL (ref 0.44–1.00)
GFR calc Af Amer: >60 mL/min (ref >60)
GFR calc non Af Amer: >60 mL/min (ref >60)
Glucose, Bld: 119 mg/dL — ABNORMAL HIGH (ref 70–99)
Potassium: 3.6 mmol/L (ref 3.5–5.1)
Sodium: 136 mmol/L (ref 135–145)

## 2019-08-01 LAB — CBC
HCT: 42.7 % (ref 36.0–46.0)
Hemoglobin: 14.4 g/dL (ref 12.0–15.0)
MCH: 30.4 pg (ref 26.0–34.0)
MCHC: 33.7 g/dL (ref 30.0–36.0)
MCV: 90.1 fL (ref 80.0–100.0)
Platelets: 262 10*3/uL (ref 150–400)
RBC: 4.74 MIL/uL (ref 3.87–5.11)
RDW: 12.7 % (ref 11.5–15.5)
WBC: 7.3 10*3/uL (ref 4.0–10.5)
nRBC: 0 % (ref 0.0–0.2)

## 2019-08-01 LAB — TROPONIN I (HIGH SENSITIVITY): Troponin I (High Sensitivity): 4 ng/L (ref <18)

## 2019-08-01 MED ORDER — SODIUM CHLORIDE 0.9% FLUSH: 3.0000 mL | Freq: Once | INTRAVENOUS | Status: DC

## 2019-08-01 NOTE — ED Triage Notes (Signed)
C/O left anterior rib pain x 4-5 days.  Patient states she has had issues with ribs in this area 'for years' .  States it hurts to have a bra on over area.  Pain worse with movement, coughing, deep breathing.  AAOx3.  Skin warm and dry. NAD

## 2019-08-06 ENCOUNTER — Other Ambulatory Visit: Payer: Self-pay

## 2019-08-06 ENCOUNTER — Encounter: Payer: Self-pay | Admitting: Ophthalmology

## 2019-08-09 ENCOUNTER — Other Ambulatory Visit: Payer: Self-pay

## 2019-08-09 ENCOUNTER — Other Ambulatory Visit
Admission: RE | Admit: 2019-08-09 | Discharge: 2019-08-09 | Disposition: A | Discharge status: Home / Self Care | Payer: Medicare HMO | Source: Ambulatory Visit | Attending: Ophthalmology | Admitting: Ophthalmology

## 2019-08-09 DIAGNOSIS — Z01812 Encounter for preprocedural laboratory examination: Secondary | ICD-10-CM | POA: Diagnosis present

## 2019-08-09 DIAGNOSIS — Z20822 Contact with and (suspected) exposure to covid-19: Secondary | ICD-10-CM | POA: Diagnosis not present

## 2019-08-09 NOTE — Discharge Instructions (Signed)

## 2019-08-10 LAB — SARS CORONAVIRUS 2 (TAT 6-24 HRS): SARS Coronavirus 2: NEGATIVE

## 2019-08-13 ENCOUNTER — Ambulatory Visit: Payer: Medicare HMO | Admitting: Anesthesiology

## 2019-08-13 ENCOUNTER — Encounter
Admission: RE | Disposition: A | Discharge status: Home / Self Care | Payer: Self-pay | Source: Home / Self Care | Attending: Ophthalmology

## 2019-08-13 ENCOUNTER — Encounter: Payer: Self-pay | Admitting: Ophthalmology

## 2019-08-13 ENCOUNTER — Other Ambulatory Visit: Payer: Self-pay

## 2019-08-13 ENCOUNTER — Ambulatory Visit
Admission: RE | Admit: 2019-08-13 | Discharge: 2019-08-13 | Disposition: A | Discharge status: Home / Self Care | Payer: Medicare HMO | Attending: Ophthalmology | Admitting: Ophthalmology

## 2019-08-13 DIAGNOSIS — H2511 Age-related nuclear cataract, right eye: Secondary | ICD-10-CM | POA: Insufficient documentation

## 2019-08-13 DIAGNOSIS — K219 Gastro-esophageal reflux disease without esophagitis: Secondary | ICD-10-CM | POA: Diagnosis not present

## 2019-08-13 DIAGNOSIS — M8580 Other specified disorders of bone density and structure, unspecified site: Secondary | ICD-10-CM | POA: Diagnosis not present

## 2019-08-13 DIAGNOSIS — I10 Essential (primary) hypertension: Secondary | ICD-10-CM | POA: Insufficient documentation

## 2019-08-13 DIAGNOSIS — F17200 Nicotine dependence, unspecified, uncomplicated: Secondary | ICD-10-CM | POA: Insufficient documentation

## 2019-08-13 DIAGNOSIS — Z888 Allergy status to other drugs, medicaments and biological substances status: Secondary | ICD-10-CM | POA: Diagnosis not present

## 2019-08-13 DIAGNOSIS — Z79899 Other long term (current) drug therapy: Secondary | ICD-10-CM | POA: Diagnosis not present

## 2019-08-13 DIAGNOSIS — Z90710 Acquired absence of both cervix and uterus: Secondary | ICD-10-CM | POA: Diagnosis not present

## 2019-08-13 HISTORY — DX: Presence of dental prosthetic device (complete) (partial): Z97.2

## 2019-08-13 HISTORY — PX: CATARACT EXTRACTION W/PHACO: SHX586

## 2019-08-13 HISTORY — DX: Gastro-esophageal reflux disease without esophagitis: K21.9

## 2019-08-13 SURGERY — PHACOEMULSIFICATION, CATARACT, WITH IOL INSERTION
Anesthesia: Monitor Anesthesia Care | Site: Eye | Laterality: Right

## 2019-08-13 MED ORDER — TETRACAINE HCL 0.5 % OP SOLN
1.0000 [drp] | OPHTHALMIC | Status: DC | PRN
  Administered 2019-08-13 (×3): 1 [drp] via OPHTHALMIC

## 2019-08-13 MED ORDER — LACTATED RINGERS IV SOLN: INTRAVENOUS | Status: DC

## 2019-08-13 MED ORDER — EPINEPHRINE PF 1 MG/ML IJ SOLN
INTRAOCULAR | Status: DC | PRN
  Administered 2019-08-13: 87 mL via OPHTHALMIC

## 2019-08-13 MED ORDER — ACETAMINOPHEN 10 MG/ML IV SOLN: 1000.0000 mg | Freq: Once | INTRAVENOUS | Status: DC | PRN

## 2019-08-13 MED ORDER — ARMC OPHTHALMIC DILATING DROPS
1.0000 "application " | OPHTHALMIC | Status: DC | PRN
  Administered 2019-08-13 (×3): 1 via OPHTHALMIC

## 2019-08-13 MED ORDER — MIDAZOLAM HCL 2 MG/2ML IJ SOLN
INTRAMUSCULAR | Status: DC | PRN
  Administered 2019-08-13: 2 mg via INTRAVENOUS

## 2019-08-13 MED ORDER — FENTANYL CITRATE (PF) 100 MCG/2ML IJ SOLN
INTRAMUSCULAR | Status: DC | PRN
  Administered 2019-08-13: 25 ug via INTRAVENOUS
  Administered 2019-08-13: 50 ug via INTRAVENOUS
  Administered 2019-08-13: 25 ug via INTRAVENOUS

## 2019-08-13 MED ORDER — MOXIFLOXACIN HCL 0.5 % OP SOLN
OPHTHALMIC | Status: DC | PRN
  Administered 2019-08-13: 0.2 mL via OPHTHALMIC

## 2019-08-13 MED ORDER — LIDOCAINE HCL (PF) 2 % IJ SOLN
INTRAOCULAR | Status: DC | PRN
  Administered 2019-08-13: 1 mL via INTRAOCULAR

## 2019-08-13 MED ORDER — SODIUM HYALURONATE 23 MG/ML IO SOLN
INTRAOCULAR | Status: DC | PRN
  Administered 2019-08-13: 0.6 mL via INTRAOCULAR

## 2019-08-13 MED ORDER — SODIUM HYALURONATE 10 MG/ML IO SOLN
INTRAOCULAR | Status: DC | PRN
  Administered 2019-08-13: 0.55 mL via INTRAOCULAR

## 2019-08-13 MED ORDER — ONDANSETRON HCL 4 MG/2ML IJ SOLN: 4.0000 mg | Freq: Once | INTRAMUSCULAR | Status: DC | PRN

## 2019-08-13 SURGICAL SUPPLY — 20 items
CANNULA ANT/CHMB 27G (MISCELLANEOUS) ×2 IMPLANT
CANNULA ANT/CHMB 27GA (MISCELLANEOUS) ×6 IMPLANT
DISSECTOR HYDRO NUCLEUS 50X22 (MISCELLANEOUS) ×3 IMPLANT
GLOVE SURG LX 7.5 STRW (GLOVE) ×2
GLOVE SURG LX STRL 7.5 STRW (GLOVE) ×1 IMPLANT
GLOVE SURG SYN 8.5  E (GLOVE) ×2
GLOVE SURG SYN 8.5 E (GLOVE) ×1 IMPLANT
GLOVE SURG SYN 8.5 PF PI (GLOVE) ×1 IMPLANT
GOWN STRL REUS W/ TWL LRG LVL3 (GOWN DISPOSABLE) ×2 IMPLANT
GOWN STRL REUS W/TWL LRG LVL3 (GOWN DISPOSABLE) ×6
LENS IOL DIOP 24.0 (Intraocular Lens) ×3 IMPLANT
LENS IOL TECNIS MONO 24.0 (Intraocular Lens) IMPLANT
MARKER SKIN DUAL TIP RULER LAB (MISCELLANEOUS) ×3 IMPLANT
PACK DR. KING ARMS (PACKS) ×3 IMPLANT
PACK EYE AFTER SURG (MISCELLANEOUS) ×3 IMPLANT
PACK OPTHALMIC (MISCELLANEOUS) ×3 IMPLANT
SYR 3ML LL SCALE MARK (SYRINGE) ×3 IMPLANT
SYR TB 1ML LUER SLIP (SYRINGE) ×3 IMPLANT
WATER STERILE IRR 250ML POUR (IV SOLUTION) ×3 IMPLANT
WIPE NON LINTING 3.25X3.25 (MISCELLANEOUS) ×3 IMPLANT

## 2019-08-13 NOTE — Anesthesia Postprocedure Evaluation (Signed)
Anesthesia Post Note  Patient: Monica Mcclure [facility]  Procedure(s) Performed: CATARACT EXTRACTION PHACO AND INTRAOCULAR LENS PLACEMENT (IOC) RIGHT 2.92  00:28.6 (Right Eye)     Patient location during evaluation: PACU Anesthesia Type: MAC Level of consciousness: awake and alert Pain management: pain level controlled Vital Signs Assessment: post-procedure vital signs reviewed and stable Respiratory status: spontaneous breathing, nonlabored ventilation, respiratory function stable and patient connected to nasal cannula oxygen Cardiovascular status: stable and blood pressure returned to baseline Postop Assessment: no apparent nausea or vomiting Anesthetic complications: no   No complications documented.  Esgar Barnick A  Monica Mcclure

## 2019-08-13 NOTE — Transfer of Care (Signed)
Immediate Anesthesia Transfer of Care Note  Patient: Monica Mcclure [facility]  Procedure(s) Performed: CATARACT EXTRACTION PHACO AND INTRAOCULAR LENS PLACEMENT (IOC) RIGHT 2.92  00:28.6 (Right Eye)  Patient Location: PACU  Anesthesia Type: MAC  Level of Consciousness: awake, alert  and patient cooperative  Airway and Oxygen Therapy: Patient Spontanous Breathing and Patient connected to supplemental oxygen  Post-op Assessment: Post-op Vital signs reviewed, Patient's Cardiovascular Status Stable, Respiratory Function Stable, Patent Airway and No signs of Nausea or vomiting  Post-op Vital Signs: Reviewed and stable  Complications: No complications documented.

## 2019-08-13 NOTE — Op Note (Signed)
OPERATIVE NOTE  Monica Mcclure [id number] 08/13/2019   PREOPERATIVE DIAGNOSIS:  Nuclear sclerotic cataract right eye.  H25.11   POSTOPERATIVE DIAGNOSIS:    Nuclear sclerotic cataract right eye.     PROCEDURE:  Phacoemusification with posterior chamber intraocular lens placement of the right eye   LENS:   Implant Name Type Inv. Item Serial No. Manufacturer Lot No. LRB No. Used Action  LENS IOL DIOP 24.0 - [id number] Intraocular Lens LENS IOL DIOP 24.0 [id number] [employer]  Right 1 Implanted       Procedure(s): CATARACT EXTRACTION PHACO AND INTRAOCULAR LENS PLACEMENT (IOC) RIGHT 2.92  00:28.6 (Right)  DCB00 +24.0   ULTRASOUND TIME: 0 minutes 28 seconds.  CDE 2.92   SURGEON:  Benay Pillow, MD, MPH  ANESTHESIOLOGIST: Anesthesiologist: Heniser, Fredric Dine, MD CRNA: Jeannene Patella, CRNA   ANESTHESIA:  Topical with tetracaine drops augmented with 1% preservative-free intracameral lidocaine.  ESTIMATED BLOOD LOSS: less than 1 mL.   COMPLICATIONS:  None.   DESCRIPTION OF PROCEDURE:  The patient was identified in the holding room and transported to the operating room and placed in the supine position under the operating microscope.  The right eye was identified as the operative eye and it was prepped and draped in the usual sterile ophthalmic fashion.   A 1.0 millimeter clear-corneal paracentesis was made at the 10:30 position. 0.5 ml of preservative-free 1% lidocaine with epinephrine was injected into the anterior chamber.  The anterior chamber was filled with Healon 5 viscoelastic.  A 2.4 millimeter keratome was used to make a near-clear corneal incision at the 8:00 position.  A curvilinear capsulorrhexis was made with a cystotome and capsulorrhexis forceps.  Balanced salt solution was used to hydrodissect and hydrodelineate the nucleus.   Phacoemulsification was then used in stop and chop fashion to remove the lens nucleus and epinucleus.  The remaining cortex was  then removed using the irrigation and aspiration handpiece. Healon was then placed into the capsular bag to distend it for lens placement.  A lens was then injected into the capsular bag.  The remaining viscoelastic was aspirated.   Wounds were hydrated with balanced salt solution.  The anterior chamber was inflated to a physiologic pressure with balanced salt solution.   Intracameral vigamox 0.1 mL undiluted was injected into the eye and a drop placed onto the ocular surface.  No wound leaks were noted.  The patient was taken to the recovery room in stable condition without complications of anesthesia or surgery  Benay Pillow 08/13/2019, 9:12 AM

## 2019-08-13 NOTE — Anesthesia Preprocedure Evaluation (Addendum)
Anesthesia Evaluation  Patient identified by MRN, date of birth, ID band Patient awake    Reviewed: Allergy & Precautions, NPO status , Patient's Chart, lab work & pertinent test results, reviewed documented beta blocker date and time   History of Anesthesia Complications Negative for: history of anesthetic complications  Airway Mallampati: III  TM Distance: >3 FB Neck ROM: Full    Dental  (+) Upper Dentures, Lower Dentures   Pulmonary Current Smoker and Patient abstained from smoking.,    breath sounds clear to auscultation       Cardiovascular hypertension, (-) angina(-) DOE  Rhythm:Regular Rate:Normal     Neuro/Psych    GI/Hepatic GERD  ,  Endo/Other    Renal/GU      Musculoskeletal  (+) Arthritis ,   Abdominal (+) + obese (BMI 30),   Peds  Hematology   Anesthesia Other Findings   Reproductive/Obstetrics                             Anesthesia Physical Anesthesia Plan  ASA: II  Anesthesia Plan: MAC   Post-op Pain Management:    Induction: Intravenous  PONV Risk Score and Plan: 1 and TIVA, Midazolam and Treatment may vary due to age or medical condition  Airway Management Planned: Nasal Cannula  Additional Equipment:   Intra-op Plan:   Post-operative Plan:   Informed Consent: I have reviewed the patients History and Physical, chart, labs and discussed the procedure including the risks, benefits and alternatives for the proposed anesthesia with the patient or authorized representative who has indicated his/her understanding and acceptance.       Plan Discussed with: CRNA and Anesthesiologist  Anesthesia Plan Comments:        Anesthesia Quick Evaluation

## 2019-08-13 NOTE — H&P (Signed)

## 2019-08-13 NOTE — Anesthesia Procedure Notes (Signed)
Procedure Name: MAC Date/Time: 08/13/2019 8:58 AM Performed by: Jeannene Patella, CRNA Pre-anesthesia Checklist: Patient identified, Emergency Drugs available, Suction available, Timeout performed and Patient being monitored Patient Re-evaluated:Patient Re-evaluated prior to induction Oxygen Delivery Method: Nasal cannula Placement Confirmation: positive ETCO2

## 2019-08-14 ENCOUNTER — Encounter: Payer: Self-pay | Admitting: Ophthalmology

## 2019-08-23 ENCOUNTER — Other Ambulatory Visit: Payer: Self-pay

## 2019-08-23 ENCOUNTER — Encounter: Payer: Self-pay | Admitting: Ophthalmology

## 2019-08-30 ENCOUNTER — Other Ambulatory Visit: Payer: Self-pay

## 2019-08-30 ENCOUNTER — Other Ambulatory Visit
Admission: RE | Admit: 2019-08-30 | Discharge: 2019-08-30 | Disposition: A | Discharge status: Home / Self Care | Payer: Medicare HMO | Source: Ambulatory Visit | Attending: Ophthalmology | Admitting: Ophthalmology

## 2019-08-30 DIAGNOSIS — Z01812 Encounter for preprocedural laboratory examination: Secondary | ICD-10-CM | POA: Diagnosis present

## 2019-08-30 DIAGNOSIS — Z20822 Contact with and (suspected) exposure to covid-19: Secondary | ICD-10-CM | POA: Insufficient documentation

## 2019-08-30 LAB — SARS CORONAVIRUS 2 (TAT 6-24 HRS): SARS Coronavirus 2: NEGATIVE

## 2019-08-30 NOTE — Discharge Instructions (Signed)

## 2019-09-03 ENCOUNTER — Encounter: Payer: Self-pay | Admitting: Ophthalmology

## 2019-09-03 ENCOUNTER — Encounter
Admission: RE | Disposition: A | Discharge status: Home / Self Care | Payer: Self-pay | Source: Home / Self Care | Attending: Ophthalmology

## 2019-09-03 ENCOUNTER — Ambulatory Visit: Payer: Medicare HMO | Admitting: Anesthesiology

## 2019-09-03 ENCOUNTER — Ambulatory Visit
Admission: RE | Admit: 2019-09-03 | Discharge: 2019-09-03 | Disposition: A | Discharge status: Home / Self Care | Payer: Medicare HMO | Attending: Ophthalmology | Admitting: Ophthalmology

## 2019-09-03 ENCOUNTER — Other Ambulatory Visit: Payer: Self-pay

## 2019-09-03 DIAGNOSIS — Z79899 Other long term (current) drug therapy: Secondary | ICD-10-CM | POA: Insufficient documentation

## 2019-09-03 DIAGNOSIS — F329 Major depressive disorder, single episode, unspecified: Secondary | ICD-10-CM | POA: Diagnosis not present

## 2019-09-03 DIAGNOSIS — Z885 Allergy status to narcotic agent status: Secondary | ICD-10-CM | POA: Diagnosis not present

## 2019-09-03 DIAGNOSIS — K219 Gastro-esophageal reflux disease without esophagitis: Secondary | ICD-10-CM | POA: Diagnosis not present

## 2019-09-03 DIAGNOSIS — E7800 Pure hypercholesterolemia, unspecified: Secondary | ICD-10-CM | POA: Diagnosis not present

## 2019-09-03 DIAGNOSIS — M1990 Unspecified osteoarthritis, unspecified site: Secondary | ICD-10-CM | POA: Diagnosis not present

## 2019-09-03 DIAGNOSIS — F17290 Nicotine dependence, other tobacco product, uncomplicated: Secondary | ICD-10-CM | POA: Insufficient documentation

## 2019-09-03 DIAGNOSIS — I10 Essential (primary) hypertension: Secondary | ICD-10-CM | POA: Insufficient documentation

## 2019-09-03 DIAGNOSIS — Z888 Allergy status to other drugs, medicaments and biological substances status: Secondary | ICD-10-CM | POA: Diagnosis not present

## 2019-09-03 DIAGNOSIS — H2512 Age-related nuclear cataract, left eye: Secondary | ICD-10-CM | POA: Diagnosis not present

## 2019-09-03 HISTORY — PX: CATARACT EXTRACTION W/PHACO: SHX586

## 2019-09-03 SURGERY — PHACOEMULSIFICATION, CATARACT, WITH IOL INSERTION
Anesthesia: General | Site: Eye | Laterality: Left

## 2019-09-03 MED ORDER — EPINEPHRINE PF 1 MG/ML IJ SOLN
INTRAOCULAR | Status: DC | PRN
  Administered 2019-09-03: 67 mL via OPHTHALMIC

## 2019-09-03 MED ORDER — MOXIFLOXACIN HCL 0.5 % OP SOLN
OPHTHALMIC | Status: DC | PRN
  Administered 2019-09-03: 0.2 mL via OPHTHALMIC

## 2019-09-03 MED ORDER — SODIUM HYALURONATE 23 MG/ML IO SOLN
INTRAOCULAR | Status: DC | PRN
  Administered 2019-09-03: 0.6 mL via INTRAOCULAR

## 2019-09-03 MED ORDER — LACTATED RINGERS IV SOLN: INTRAVENOUS | Status: DC

## 2019-09-03 MED ORDER — LIDOCAINE HCL (PF) 2 % IJ SOLN
INTRAOCULAR | Status: DC | PRN
  Administered 2019-09-03: 1 mL via INTRAOCULAR

## 2019-09-03 MED ORDER — SODIUM HYALURONATE 10 MG/ML IO SOLN
INTRAOCULAR | Status: DC | PRN
  Administered 2019-09-03: 0.55 mL via INTRAOCULAR

## 2019-09-03 MED ORDER — MIDAZOLAM HCL 2 MG/2ML IJ SOLN
INTRAMUSCULAR | Status: DC | PRN
  Administered 2019-09-03: 2 mg via INTRAVENOUS

## 2019-09-03 MED ORDER — TETRACAINE HCL 0.5 % OP SOLN
1.0000 [drp] | OPHTHALMIC | Status: DC | PRN
  Administered 2019-09-03 (×3): 1 [drp] via OPHTHALMIC

## 2019-09-03 MED ORDER — FENTANYL CITRATE (PF) 100 MCG/2ML IJ SOLN
INTRAMUSCULAR | Status: DC | PRN
Start: 2019-09-03 — End: 2019-09-03
  Administered 2019-09-03: 100 ug via INTRAVENOUS

## 2019-09-03 MED ORDER — FENTANYL CITRATE (PF) 100 MCG/2ML IJ SOLN: 25.0000 ug | INTRAMUSCULAR | Status: DC | PRN

## 2019-09-03 MED ORDER — ARMC OPHTHALMIC DILATING DROPS
1.0000 "application " | OPHTHALMIC | Status: DC | PRN
  Administered 2019-09-03 (×3): 1 via OPHTHALMIC

## 2019-09-03 SURGICAL SUPPLY — 20 items
CANNULA ANT/CHMB 27G (MISCELLANEOUS) ×2 IMPLANT
CANNULA ANT/CHMB 27GA (MISCELLANEOUS) ×6 IMPLANT
DISSECTOR HYDRO NUCLEUS 50X22 (MISCELLANEOUS) ×3 IMPLANT
GLOVE SURG LX 7.5 STRW (GLOVE) ×2
GLOVE SURG LX STRL 7.5 STRW (GLOVE) ×1 IMPLANT
GLOVE SURG SYN 8.5  E (GLOVE) ×2
GLOVE SURG SYN 8.5 E (GLOVE) ×1 IMPLANT
GLOVE SURG SYN 8.5 PF PI (GLOVE) ×1 IMPLANT
GOWN STRL REUS W/ TWL LRG LVL3 (GOWN DISPOSABLE) ×2 IMPLANT
GOWN STRL REUS W/TWL LRG LVL3 (GOWN DISPOSABLE) ×6
LENS IOL DIOP 24.0 (Intraocular Lens) ×3 IMPLANT
LENS IOL TECNIS MONO 24.0 (Intraocular Lens) IMPLANT
MARKER SKIN DUAL TIP RULER LAB (MISCELLANEOUS) ×3 IMPLANT
PACK DR. KING ARMS (PACKS) ×3 IMPLANT
PACK EYE AFTER SURG (MISCELLANEOUS) ×3 IMPLANT
PACK OPTHALMIC (MISCELLANEOUS) ×3 IMPLANT
SYR 3ML LL SCALE MARK (SYRINGE) ×3 IMPLANT
SYR TB 1ML LUER SLIP (SYRINGE) ×3 IMPLANT
WATER STERILE IRR 250ML POUR (IV SOLUTION) ×3 IMPLANT
WIPE NON LINTING 3.25X3.25 (MISCELLANEOUS) ×3 IMPLANT

## 2019-09-03 NOTE — Anesthesia Preprocedure Evaluation (Signed)
Anesthesia Evaluation  Patient identified by MRN, date of birth, ID band Patient awake    Reviewed: Allergy & Precautions, H&P , NPO status , Patient's Chart, lab work & pertinent test results, reviewed documented beta blocker date and time   History of Anesthesia Complications (+) PONV and history of anesthetic complications  Airway Mallampati: II  TM Distance: >3 FB Neck ROM: full    Dental no notable dental hx. (+) Upper Dentures, Lower Dentures   Pulmonary neg pulmonary ROS, Current Smoker and Patient abstained from smoking.,    Pulmonary exam normal breath sounds clear to auscultation       Cardiovascular Exercise Tolerance: Good hypertension, (-) angina(-) DOE negative cardio ROS   Rhythm:regular Rate:Normal     Neuro/Psych negative neurological ROS  negative psych ROS   GI/Hepatic Neg liver ROS, GERD  ,  Endo/Other  negative endocrine ROS  Renal/GU negative Renal ROS  negative genitourinary   Musculoskeletal  (+) Arthritis ,   Abdominal (+) + obese (BMI 30),   Peds  Hematology negative hematology ROS (+)   Anesthesia Other Findings   Reproductive/Obstetrics negative OB ROS                             Anesthesia Physical  Anesthesia Plan  ASA: II  Anesthesia Plan: General   Post-op Pain Management:    Induction: Intravenous  PONV Risk Score and Plan: 1 and TIVA, Midazolam and Treatment may vary due to age or medical condition  Airway Management Planned: Nasal Cannula  Additional Equipment:   Intra-op Plan:   Post-operative Plan:   Informed Consent: I have reviewed the patients History and Physical, chart, labs and discussed the procedure including the risks, benefits and alternatives for the proposed anesthesia with the patient or authorized representative who has indicated his/her understanding and acceptance.     Dental Advisory Given  Plan Discussed with:  CRNA  Anesthesia Plan Comments:         Anesthesia Quick Evaluation

## 2019-09-03 NOTE — Op Note (Signed)
OPERATIVE NOTE  Monica Mcclure [id number] 09/03/2019   PREOPERATIVE DIAGNOSIS:  Nuclear sclerotic cataract left eye.  H25.12   POSTOPERATIVE DIAGNOSIS:    Nuclear sclerotic cataract left eye.     PROCEDURE:  Phacoemusification with posterior chamber intraocular lens placement of the left eye   LENS:   Implant Name Type Inv. Item Serial No. Manufacturer Lot No. LRB No. Used Action  LENS IOL DIOP 24.0 - [id number] Intraocular Lens LENS IOL DIOP 24.0 [id number] [employer]  Left 1 Implanted      Procedure(s): CATARACT EXTRACTION PHACO AND INTRAOCULAR LENS PLACEMENT (IOC) LEFT 1.97 00:31.6 (Left)  DCB00 +24.0   ULTRASOUND TIME: 0 minutes 31 seconds.  CDE 1.97   SURGEON:  Benay Pillow, MD, MPH   ANESTHESIA:  Topical with tetracaine drops augmented with 1% preservative-free intracameral lidocaine.  ESTIMATED BLOOD LOSS: <1 mL   COMPLICATIONS:  None.   DESCRIPTION OF PROCEDURE:  The patient was identified in the holding room and transported to the operating room and placed in the supine position under the operating microscope.  The left eye was identified as the operative eye and it was prepped and draped in the usual sterile ophthalmic fashion.   A 1.0 millimeter clear-corneal paracentesis was made at the 5:00 position. 0.5 ml of preservative-free 1% lidocaine with epinephrine was injected into the anterior chamber.  The anterior chamber was filled with Healon 5 viscoelastic.  A 2.4 millimeter keratome was used to make a near-clear corneal incision at the 2:00 position.  A curvilinear capsulorrhexis was made with a cystotome and capsulorrhexis forceps.  Balanced salt solution was used to hydrodissect and hydrodelineate the nucleus.   Phacoemulsification was then used in stop and chop fashion to remove the lens nucleus and epinucleus.  The remaining cortex was then removed using the irrigation and aspiration handpiece. Healon was then placed into the capsular bag to  distend it for lens placement.  A lens was then injected into the capsular bag.  The remaining viscoelastic was aspirated.   Wounds were hydrated with balanced salt solution.  The anterior chamber was inflated to a physiologic pressure with balanced salt solution.  Intracameral vigamox 0.1 mL undiltued was injected into the eye and a drop placed onto the ocular surface.  No wound leaks were noted.  The patient was taken to the recovery room in stable condition without complications of anesthesia or surgery  Benay Pillow 09/03/2019, 10:02 AM

## 2019-09-03 NOTE — Anesthesia Procedure Notes (Signed)
Procedure Name: MAC Date/Time: 09/03/2019 9:44 AM Performed by: Silvana Newness, CRNA Pre-anesthesia Checklist: Patient identified, Emergency Drugs available, Suction available, Patient being monitored and Timeout performed Patient Re-evaluated:Patient Re-evaluated prior to induction Oxygen Delivery Method: Nasal cannula Placement Confirmation: positive ETCO2

## 2019-09-03 NOTE — H&P (Signed)

## 2019-09-03 NOTE — Anesthesia Postprocedure Evaluation (Signed)
Anesthesia Post Note  Patient: Monica Mcclure [facility]  Procedure(s) Performed: CATARACT EXTRACTION PHACO AND INTRAOCULAR LENS PLACEMENT (IOC) LEFT 1.97 00:31.6 (Left Eye)     Patient location during evaluation: PACU Anesthesia Type: General Level of consciousness: awake and alert Pain management: pain level controlled Vital Signs Assessment: post-procedure vital signs reviewed and stable Respiratory status: spontaneous breathing, nonlabored ventilation, respiratory function stable and patient connected to nasal cannula oxygen Cardiovascular status: stable and blood pressure returned to baseline Postop Assessment: no apparent nausea or vomiting Anesthetic complications: no   No complications documented.  Lakyia Behe, Glade [place]

## 2019-09-03 NOTE — Transfer of Care (Signed)
Immediate Anesthesia Transfer of Care Note  Patient: Monica Mcclure [facility]  Procedure(s) Performed: CATARACT EXTRACTION PHACO AND INTRAOCULAR LENS PLACEMENT (IOC) LEFT 1.97 00:31.6 (Left Eye)  Patient Location: PACU  Anesthesia Type: General  Level of Consciousness: awake, alert  and patient cooperative  Airway and Oxygen Therapy: Patient Spontanous Breathing and Patient connected to supplemental oxygen  Post-op Assessment: Post-op Vital signs reviewed, Patient's Cardiovascular Status Stable, Respiratory Function Stable, Patent Airway and No signs of Nausea or vomiting  Post-op Vital Signs: Reviewed and stable  Complications: No complications documented.

## 2019-09-04 ENCOUNTER — Encounter: Payer: Self-pay | Admitting: Ophthalmology

## 2021-02-05 IMAGING — CR DG CHEST 2V
1 series · 2 of 2 positions shown · non-contrast
Comparison: None.

CLINICAL DATA: C/O left anterior rib pain x 4-5 days. Pain lying on
left side. Patient states she has had broken ribs on left side
previously. Pain worse with movement, coughing, deep breathing.

EXAM:
CHEST - 2 VIEW

[Series 1: w chest pa · 0.14mm/px · 2 of 2 slices shown]
[im 1/2]
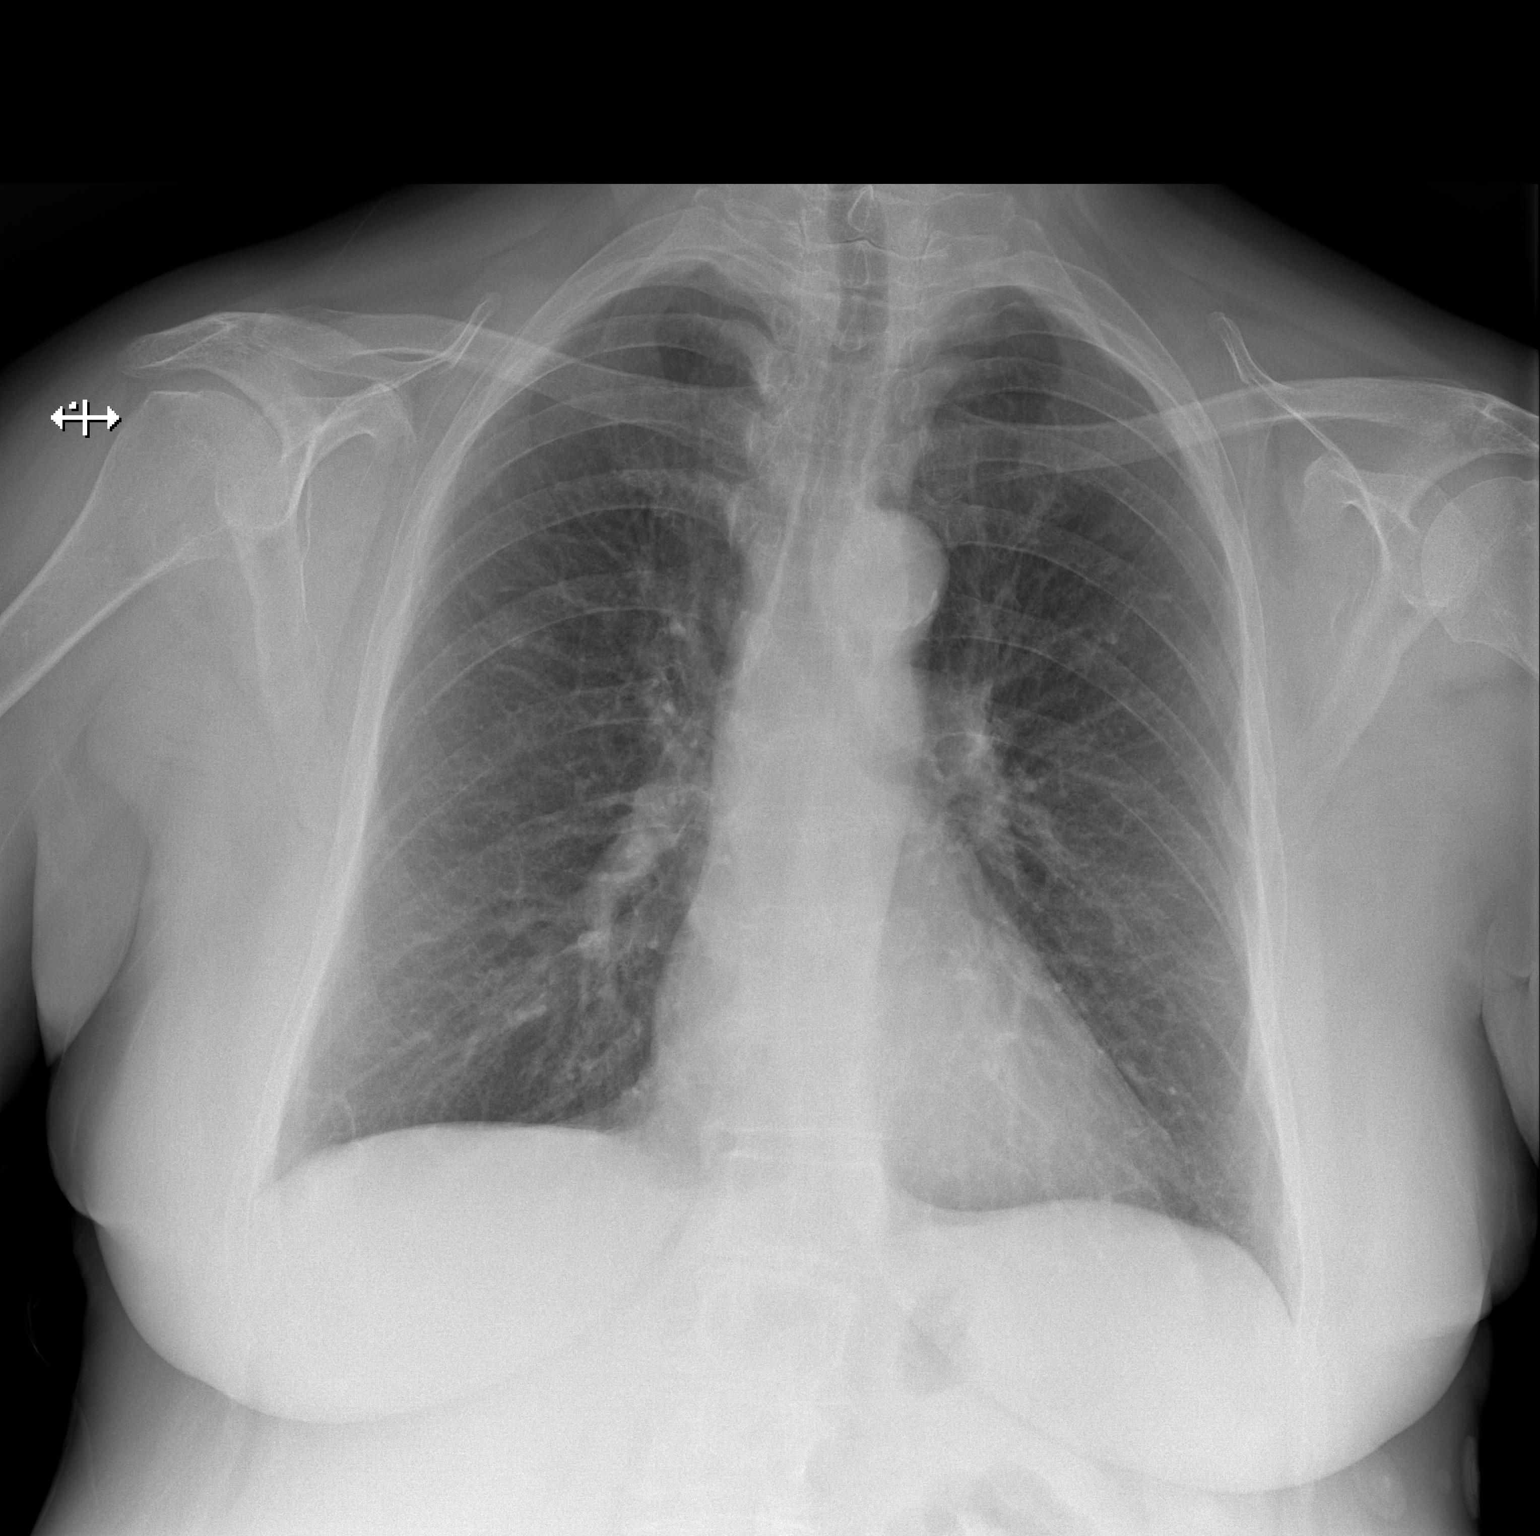
[im 2/2]
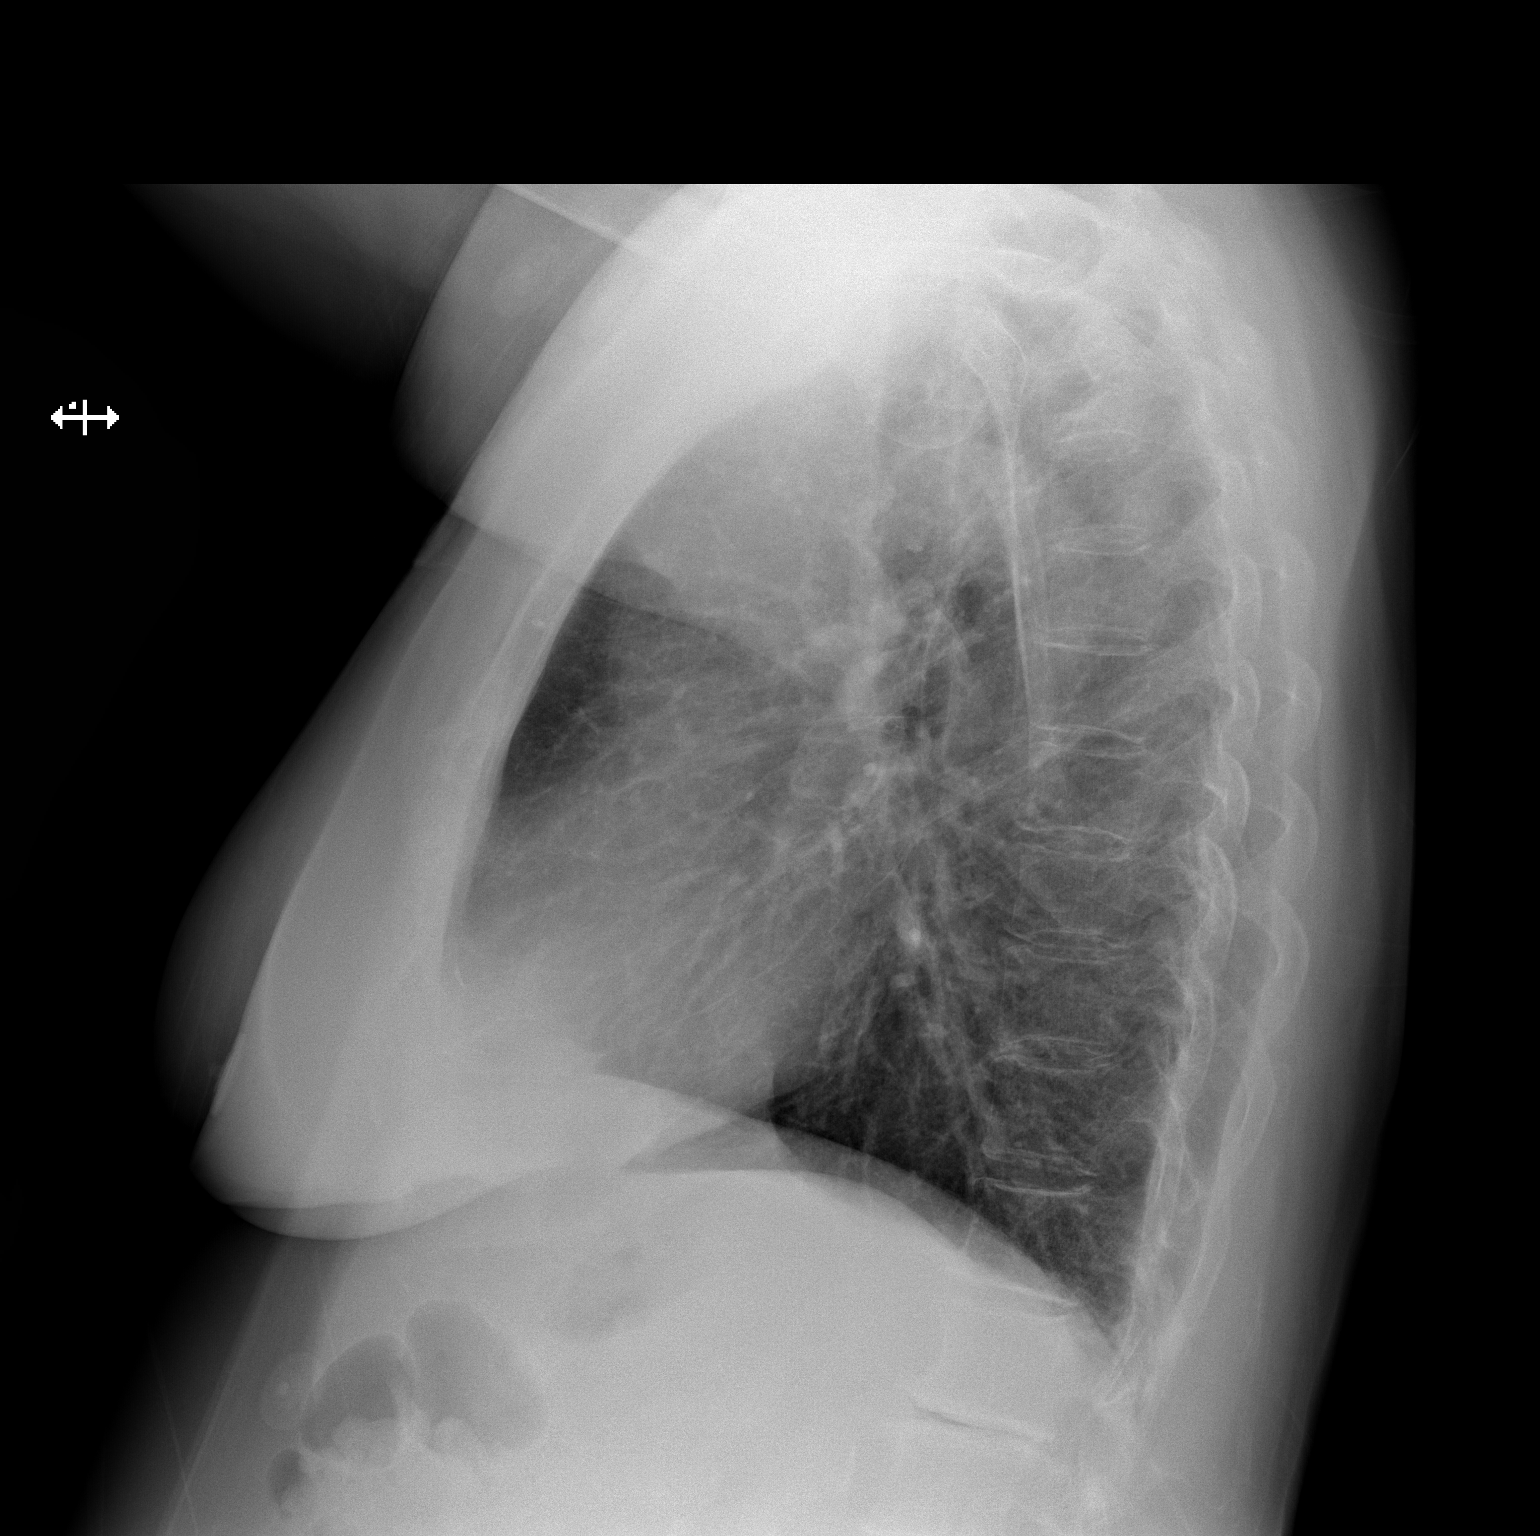

[2 of 2 positions shown; findings below may reference images not displayed]

FINDINGS: The heart size and mediastinal contours are within normal limits.
Both lungs are clear. No pleural effusion or pneumothorax. The
visualized skeletal structures are unremarkable.
IMPRESSION: No active cardiopulmonary disease.
